# Patient Record
Sex: Female | Born: 1950
Health system: Southern US, Community
[De-identification: ages and names within clinical notes are randomized; demographics above are authoritative.]

## PROBLEM LIST (undated history)

## (undated) DIAGNOSIS — I10 Essential (primary) hypertension: Secondary | ICD-10-CM

## (undated) DIAGNOSIS — Z889 Allergy status to unspecified drugs, medicaments and biological substances status: Secondary | ICD-10-CM

---

## 1999-11-15 ENCOUNTER — Encounter: Admission: RE | Admit: 1999-11-15 | Discharge: 1999-11-15 | Payer: Self-pay | Admitting: Family Medicine

## 1999-11-15 ENCOUNTER — Encounter: Payer: Self-pay | Admitting: Family Medicine

## 2001-01-30 ENCOUNTER — Encounter: Payer: Self-pay | Admitting: Family Medicine

## 2001-01-30 ENCOUNTER — Encounter: Admission: RE | Admit: 2001-01-30 | Discharge: 2001-01-30 | Payer: Self-pay | Admitting: Dermatology

## 2001-10-16 ENCOUNTER — Other Ambulatory Visit: Admission: RE | Admit: 2001-10-16 | Discharge: 2001-10-16 | Payer: Self-pay | Admitting: Family Medicine

## 2002-02-27 ENCOUNTER — Encounter: Admission: RE | Admit: 2002-02-27 | Discharge: 2002-02-27 | Payer: Self-pay | Admitting: Obstetrics and Gynecology

## 2002-02-27 ENCOUNTER — Encounter: Payer: Self-pay | Admitting: Obstetrics and Gynecology

## 2003-06-20 ENCOUNTER — Emergency Department (HOSPITAL_COMMUNITY): Admission: EM | Admit: 2003-06-20 | Discharge: 2003-06-20 | Payer: Self-pay | Admitting: Emergency Medicine

## 2003-06-29 ENCOUNTER — Encounter: Payer: Self-pay | Admitting: Emergency Medicine

## 2003-06-29 ENCOUNTER — Emergency Department (HOSPITAL_COMMUNITY): Admission: EM | Admit: 2003-06-29 | Discharge: 2003-06-29 | Payer: Self-pay | Admitting: Emergency Medicine

## 2003-10-07 ENCOUNTER — Other Ambulatory Visit: Admission: RE | Admit: 2003-10-07 | Discharge: 2003-10-07 | Payer: Self-pay | Admitting: Obstetrics and Gynecology

## 2003-10-28 ENCOUNTER — Encounter: Admission: RE | Admit: 2003-10-28 | Discharge: 2003-10-28 | Payer: Self-pay | Admitting: Obstetrics and Gynecology

## 2003-11-20 ENCOUNTER — Encounter: Admission: RE | Admit: 2003-11-20 | Discharge: 2003-11-20 | Payer: Self-pay | Admitting: Obstetrics and Gynecology

## 2003-12-31 ENCOUNTER — Emergency Department (HOSPITAL_COMMUNITY): Admission: EM | Admit: 2003-12-31 | Discharge: 2003-12-31 | Payer: Self-pay | Admitting: Emergency Medicine

## 2004-10-06 ENCOUNTER — Other Ambulatory Visit: Admission: RE | Admit: 2004-10-06 | Discharge: 2004-10-06 | Payer: Self-pay | Admitting: Obstetrics and Gynecology

## 2004-11-08 ENCOUNTER — Ambulatory Visit (HOSPITAL_COMMUNITY): Admission: RE | Admit: 2004-11-08 | Discharge: 2004-11-08 | Payer: Self-pay | Admitting: Obstetrics and Gynecology

## 2005-07-04 ENCOUNTER — Ambulatory Visit (HOSPITAL_COMMUNITY): Admission: RE | Admit: 2005-07-04 | Discharge: 2005-07-04 | Payer: Self-pay | Admitting: Gastroenterology

## 2005-10-09 ENCOUNTER — Other Ambulatory Visit: Admission: RE | Admit: 2005-10-09 | Discharge: 2005-10-09 | Payer: Self-pay | Admitting: Obstetrics and Gynecology

## 2005-11-14 ENCOUNTER — Other Ambulatory Visit: Admission: RE | Admit: 2005-11-14 | Discharge: 2005-11-14 | Payer: Self-pay | Admitting: Obstetrics and Gynecology

## 2006-03-23 ENCOUNTER — Other Ambulatory Visit: Admission: RE | Admit: 2006-03-23 | Discharge: 2006-03-23 | Payer: Self-pay | Admitting: Obstetrics and Gynecology

## 2006-05-09 ENCOUNTER — Ambulatory Visit (HOSPITAL_COMMUNITY): Admission: RE | Admit: 2006-05-09 | Discharge: 2006-05-09 | Payer: Self-pay | Admitting: Obstetrics and Gynecology

## 2006-07-26 ENCOUNTER — Other Ambulatory Visit: Admission: RE | Admit: 2006-07-26 | Discharge: 2006-07-26 | Payer: Self-pay | Admitting: Obstetrics and Gynecology

## 2006-10-11 ENCOUNTER — Ambulatory Visit (HOSPITAL_COMMUNITY): Admission: RE | Admit: 2006-10-11 | Discharge: 2006-10-11 | Payer: Self-pay | Admitting: Obstetrics and Gynecology

## 2009-09-29 ENCOUNTER — Ambulatory Visit (HOSPITAL_COMMUNITY): Admission: RE | Admit: 2009-09-29 | Discharge: 2009-09-29 | Payer: Self-pay | Admitting: Internal Medicine

## 2011-01-17 ENCOUNTER — Other Ambulatory Visit (HOSPITAL_COMMUNITY): Payer: Self-pay | Admitting: Obstetrics and Gynecology

## 2011-01-17 DIAGNOSIS — Z1231 Encounter for screening mammogram for malignant neoplasm of breast: Secondary | ICD-10-CM

## 2011-01-20 ENCOUNTER — Ambulatory Visit (HOSPITAL_COMMUNITY)
Admission: RE | Admit: 2011-01-20 | Discharge: 2011-01-20 | Disposition: A | Discharge status: Home / Self Care | Payer: Medicare PPO | Source: Ambulatory Visit | Attending: Obstetrics and Gynecology | Admitting: Obstetrics and Gynecology

## 2011-01-20 DIAGNOSIS — Z1231 Encounter for screening mammogram for malignant neoplasm of breast: Secondary | ICD-10-CM

## 2011-12-13 ENCOUNTER — Emergency Department (HOSPITAL_BASED_OUTPATIENT_CLINIC_OR_DEPARTMENT_OTHER)
Admission: EM | Admit: 2011-12-13 | Discharge: 2011-12-13 | Disposition: A | Discharge status: Home / Self Care | Payer: No Typology Code available for payment source | Attending: Emergency Medicine | Admitting: Emergency Medicine

## 2011-12-13 ENCOUNTER — Emergency Department (INDEPENDENT_AMBULATORY_CARE_PROVIDER_SITE_OTHER): Payer: No Typology Code available for payment source

## 2011-12-13 ENCOUNTER — Encounter (HOSPITAL_BASED_OUTPATIENT_CLINIC_OR_DEPARTMENT_OTHER): Payer: Self-pay | Admitting: Emergency Medicine

## 2011-12-13 DIAGNOSIS — S161XXA Strain of muscle, fascia and tendon at neck level, initial encounter: Secondary | ICD-10-CM

## 2011-12-13 DIAGNOSIS — M542 Cervicalgia: Secondary | ICD-10-CM

## 2011-12-13 DIAGNOSIS — S139XXA Sprain of joints and ligaments of unspecified parts of neck, initial encounter: Secondary | ICD-10-CM | POA: Insufficient documentation

## 2011-12-13 DIAGNOSIS — M25519 Pain in unspecified shoulder: Secondary | ICD-10-CM

## 2011-12-13 DIAGNOSIS — R51 Headache: Secondary | ICD-10-CM

## 2011-12-13 DIAGNOSIS — Y92410 Unspecified street and highway as the place of occurrence of the external cause: Secondary | ICD-10-CM | POA: Insufficient documentation

## 2011-12-13 MED ORDER — OXYCODONE-ACETAMINOPHEN 5-325 MG PO TABS: 2.0000 | ORAL_TABLET | ORAL | Status: AC | PRN

## 2011-12-13 MED ORDER — CYCLOBENZAPRINE HCL 10 MG PO TABS: 10.0000 mg | ORAL_TABLET | Freq: Two times a day (BID) | ORAL | Status: AC | PRN

## 2011-12-13 MED ORDER — OXYCODONE-ACETAMINOPHEN 5-325 MG PO TABS: 1.0000 | ORAL_TABLET | ORAL | Status: AC | PRN

## 2011-12-13 MED ORDER — OXYCODONE-ACETAMINOPHEN 5-325 MG PO TABS
1.0000 | ORAL_TABLET | Freq: Once | ORAL | Status: AC
  Administered 2011-12-13: 1 via ORAL
  Filled 2011-12-13: qty 1
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
## 2011-12-13 NOTE — ED Notes (Signed)
MD at bedside. 
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
## 2012-01-04 ENCOUNTER — Ambulatory Visit: Payer: Medicare Other | Attending: Family Medicine | Admitting: Physical Therapy

## 2012-01-04 DIAGNOSIS — M545 Low back pain, unspecified: Secondary | ICD-10-CM | POA: Insufficient documentation

## 2012-01-04 DIAGNOSIS — IMO0001 Reserved for inherently not codable concepts without codable children: Secondary | ICD-10-CM | POA: Insufficient documentation

## 2012-01-04 DIAGNOSIS — M546 Pain in thoracic spine: Secondary | ICD-10-CM | POA: Insufficient documentation

## 2012-01-09 ENCOUNTER — Ambulatory Visit: Payer: Medicare Other | Admitting: Physical Therapy

## 2012-01-11 ENCOUNTER — Ambulatory Visit: Payer: Medicare Other | Admitting: Physical Therapy

## 2012-01-12 ENCOUNTER — Ambulatory Visit: Payer: Medicare Other | Admitting: Physical Therapy

## 2012-01-15 ENCOUNTER — Ambulatory Visit: Payer: Medicare Other | Admitting: Physical Therapy

## 2012-01-16 ENCOUNTER — Ambulatory Visit: Payer: Medicare Other | Admitting: Physical Therapy

## 2012-01-18 ENCOUNTER — Other Ambulatory Visit: Payer: Self-pay | Admitting: Family Medicine

## 2012-01-18 ENCOUNTER — Ambulatory Visit: Payer: Medicare Other | Admitting: Physical Therapy

## 2012-01-18 ENCOUNTER — Other Ambulatory Visit (HOSPITAL_COMMUNITY)
Admission: RE | Admit: 2012-01-18 | Discharge: 2012-01-18 | Disposition: A | Discharge status: Home / Self Care | Payer: Medicare Other | Source: Ambulatory Visit | Attending: Family Medicine | Admitting: Family Medicine

## 2012-01-18 DIAGNOSIS — Z124 Encounter for screening for malignant neoplasm of cervix: Secondary | ICD-10-CM | POA: Insufficient documentation

## 2012-01-23 ENCOUNTER — Other Ambulatory Visit (HOSPITAL_COMMUNITY): Payer: Self-pay | Admitting: Family Medicine

## 2012-01-23 ENCOUNTER — Ambulatory Visit: Payer: Medicare Other | Admitting: Physical Therapy

## 2012-01-23 DIAGNOSIS — Z1231 Encounter for screening mammogram for malignant neoplasm of breast: Secondary | ICD-10-CM

## 2012-01-25 ENCOUNTER — Ambulatory Visit: Payer: Medicare Other | Admitting: Physical Therapy

## 2012-02-09 ENCOUNTER — Ambulatory Visit (HOSPITAL_COMMUNITY)
Admission: RE | Admit: 2012-02-09 | Discharge: 2012-02-09 | Disposition: A | Discharge status: Home / Self Care | Payer: Medicare Other | Source: Ambulatory Visit | Attending: Family Medicine | Admitting: Family Medicine

## 2012-02-09 DIAGNOSIS — Z1231 Encounter for screening mammogram for malignant neoplasm of breast: Secondary | ICD-10-CM | POA: Insufficient documentation

## 2013-02-12 ENCOUNTER — Telehealth: Payer: Self-pay | Admitting: *Deleted

## 2013-02-12 NOTE — Telephone Encounter (Signed)
Opened in error

## 2013-05-12 ENCOUNTER — Other Ambulatory Visit (HOSPITAL_COMMUNITY): Payer: Self-pay | Admitting: Family Medicine

## 2013-05-12 DIAGNOSIS — Z1231 Encounter for screening mammogram for malignant neoplasm of breast: Secondary | ICD-10-CM

## 2013-05-26 ENCOUNTER — Ambulatory Visit (HOSPITAL_COMMUNITY): Payer: Medicare Other

## 2013-06-04 ENCOUNTER — Ambulatory Visit (HOSPITAL_COMMUNITY)
Admission: RE | Admit: 2013-06-04 | Discharge: 2013-06-04 | Disposition: A | Discharge status: Home / Self Care | Payer: Medicare Other | Source: Ambulatory Visit | Attending: Family Medicine | Admitting: Family Medicine

## 2013-06-04 DIAGNOSIS — Z1231 Encounter for screening mammogram for malignant neoplasm of breast: Secondary | ICD-10-CM

## 2014-07-11 ENCOUNTER — Encounter (HOSPITAL_COMMUNITY): Payer: Self-pay | Admitting: Emergency Medicine

## 2014-07-11 ENCOUNTER — Emergency Department (HOSPITAL_COMMUNITY): Payer: Medicare Other

## 2014-07-11 ENCOUNTER — Emergency Department (HOSPITAL_COMMUNITY)
Admission: EM | Admit: 2014-07-11 | Discharge: 2014-07-11 | Disposition: A | Discharge status: Home / Self Care | Payer: Medicare Other | Attending: Emergency Medicine | Admitting: Emergency Medicine

## 2014-07-11 DIAGNOSIS — N289 Disorder of kidney and ureter, unspecified: Secondary | ICD-10-CM

## 2014-07-11 DIAGNOSIS — Z880 Allergy status to penicillin: Secondary | ICD-10-CM | POA: Diagnosis not present

## 2014-07-11 DIAGNOSIS — Z79899 Other long term (current) drug therapy: Secondary | ICD-10-CM | POA: Diagnosis not present

## 2014-07-11 DIAGNOSIS — R55 Syncope and collapse: Secondary | ICD-10-CM | POA: Diagnosis present

## 2014-07-11 DIAGNOSIS — E860 Dehydration: Secondary | ICD-10-CM | POA: Insufficient documentation

## 2014-07-11 LAB — COMPREHENSIVE METABOLIC PANEL
ALBUMIN: 3.9 g/dL (ref 3.5–5.2)
ALT: 21 U/L (ref 0–35)
ANION GAP: 12 (ref 5–15)
AST: 21 U/L (ref 0–37)
Alkaline Phosphatase: 95 U/L (ref 39–117)
BUN: 18 mg/dL (ref 6–23)
CALCIUM: 9 mg/dL (ref 8.4–10.5)
CHLORIDE: 106 meq/L (ref 96–112)
CO2: 26 meq/L (ref 19–32)
Creatinine, Ser: 1.23 mg/dL — ABNORMAL HIGH (ref 0.50–1.10)
GFR calc Af Amer: 54 mL/min — ABNORMAL LOW (ref >90)
GFR calc non Af Amer: 46 mL/min — ABNORMAL LOW (ref >90)
GLUCOSE: 118 mg/dL — AB (ref 70–99)
POTASSIUM: 4.6 meq/L (ref 3.7–5.3)
Sodium: 144 mEq/L (ref 137–147)
TOTAL PROTEIN: 7.5 g/dL (ref 6.0–8.3)
Total Bilirubin: 0.2 mg/dL — ABNORMAL LOW (ref 0.3–1.2)

## 2014-07-11 LAB — TROPONIN I

## 2014-07-11 LAB — LACTIC ACID, PLASMA: Lactic Acid, Venous: 1.2 mmol/L (ref 0.5–2.2)

## 2014-07-11 LAB — CBC WITH DIFFERENTIAL/PLATELET
Basophils Absolute: 0 10*3/uL (ref 0.0–0.1)
Basophils Relative: 0 % (ref 0–1)
Eosinophils Absolute: 0 10*3/uL (ref 0.0–0.7)
Eosinophils Relative: 0 % (ref 0–5)
HCT: 42.3 % (ref 36.0–46.0)
HEMOGLOBIN: 14.1 g/dL (ref 12.0–15.0)
Lymphocytes Relative: 13 % (ref 12–46)
Lymphs Abs: 1.4 10*3/uL (ref 0.7–4.0)
MCH: 29.9 pg (ref 26.0–34.0)
MCHC: 33.3 g/dL (ref 30.0–36.0)
MCV: 89.6 fL (ref 78.0–100.0)
MONO ABS: 0.6 10*3/uL (ref 0.1–1.0)
Monocytes Relative: 6 % (ref 3–12)
Neutro Abs: 8.4 10*3/uL — ABNORMAL HIGH (ref 1.7–7.7)
Neutrophils Relative %: 81 % — ABNORMAL HIGH (ref 43–77)
Platelets: 208 10*3/uL (ref 150–400)
RBC: 4.72 MIL/uL (ref 3.87–5.11)
RDW: 13.2 % (ref 11.5–15.5)
WBC: 10.4 10*3/uL (ref 4.0–10.5)

## 2014-07-11 NOTE — Discharge Instructions (Signed)

## 2014-07-11 NOTE — ED Provider Notes (Signed)
CSN: [id number]     Arrival date & time 07/11/14  1702 History   First MD Initiated Contact with Patient 07/11/14 1713     Chief Complaint  Patient presents with  . Near Syncope      Patient is a 63 y.o. female presenting with near-syncope.  Near Syncope  Pt was in downtown [place] at [employer] and had not been taking in much POs throughout the day. She had a near syncopal episode and her intial systolic BP was in the 70J. Pt was symptomatic with dizziness and so received about 750 of NS bolus IV in the field. No chest pain or ShOB.  Patient denies taking medications.  She did not have total syncope.  She was outside when this occurred but she denies having any vomiting or diarrhea prior to this.    History reviewed. No pertinent past medical history. History reviewed. No pertinent past surgical history. History reviewed. No pertinent family history. History  Substance Use Topics  . Smoking status: Never Smoker   . Smokeless tobacco: Not on file  . Alcohol Use: No   OB History   Grav Para Term Preterm Abortions TAB SAB Ect Mult Living                 Review of Systems  Cardiovascular: Positive for near-syncope.  All other systems reviewed and are negative.     Allergies  Ampicillin; Macrodantin; Other; and Penicillins  Home Medications   Prior to Admission medications   Medication Sig Start Date End Date Taking? Authorizing Provider  diphenhydrAMINE (BENADRYL) 25 MG tablet Take 25 mg by mouth every 6 (six) hours as needed for allergies.   Yes Historical Provider, MD  montelukast (SINGULAIR) 10 MG tablet Take 10 mg by mouth at bedtime.   Yes Historical Provider, MD   There were no vitals taken for this visit. Physical Exam  Nursing note and vitals reviewed. Constitutional: She is oriented to person, place, and time. She appears well-developed and well-nourished. No distress.  HENT:  Head: Normocephalic and atraumatic.  Eyes: Pupils are equal, round, and reactive  to light.  Neck: Normal range of motion.  Cardiovascular: Normal rate and intact distal pulses.   Pulmonary/Chest: No respiratory distress.  Abdominal: Normal appearance. She exhibits no distension.  Musculoskeletal: Normal range of motion.  Neurological: She is alert and oriented to person, place, and time. No cranial nerve deficit.  Skin: Skin is warm and dry. No rash noted.  Psychiatric: She has a normal mood and affect. Her behavior is normal.    ED Course  Procedures (including critical care time) Labs Review Labs Reviewed  CBC WITH DIFFERENTIAL - Abnormal; Notable for the following:    Neutrophils Relative % 81 (*)    Neutro Abs 8.4 (*)    All other components within normal limits  COMPREHENSIVE METABOLIC PANEL - Abnormal; Notable for the following:    Glucose, Bld 118 (*)    Creatinine, Ser 1.23 (*)    Total Bilirubin 0.2 (*)    GFR calc non Af Amer 46 (*)    GFR calc Af Amer 54 (*)    All other components within normal limits  LACTIC ACID, PLASMA  TROPONIN I    Imaging Review Dg Chest Portable 1 View  07/11/2014   CLINICAL DATA:  Near syncope.  EXAM: PORTABLE CHEST - 1 VIEW  COMPARISON:  None.  FINDINGS: The heart size and mediastinal contours are within normal limits. Both lungs are clear. The visualized  skeletal structures are unremarkable.  IMPRESSION: No active disease.   Electronically Signed   By: Kerby Moors M.D.   On: 07/11/2014 18:06     EKG Interpretation   Date/Time:  Saturday July 11 2014 18:09:31 EDT Ventricular Rate:  81 PR Interval:  231 QRS Duration: 68 QT Interval:  380 QTC Calculation: 441 R Axis:   37 Text Interpretation:  Sinus rhythm Prolonged PR interval Probable left  atrial enlargement Low voltage, precordial leads Confirmed by Paizlee Kinder  MD,  Labrian Torregrossa ([id number]) on 07/11/2014 6:35:07 PM     After treatment in the ED the patient feels back to baseline and wants to go home. MDM   Final diagnoses:  Dehydration  Renal insufficiency         Dot Lanes, MD 07/11/14 2002

## 2014-07-11 NOTE — ED Notes (Signed)
Bed: XK55 Expected date: 07/11/14 Expected time:  Means of arrival:  Comments: Syncopal

## 2014-07-11 NOTE — ED Notes (Signed)
Pt was in [place] at [employer] and had not been taking in much POs throughout the day. She had a near syncopal episode and her intial systolic BP was in the 42A. Pt was symptomatic with dizziness and so received about 750 of NS bolus IV in the field. No chest pain or ShOB.

## 2014-07-24 ENCOUNTER — Other Ambulatory Visit (HOSPITAL_COMMUNITY): Payer: Self-pay | Admitting: Family Medicine

## 2014-07-24 DIAGNOSIS — Z1231 Encounter for screening mammogram for malignant neoplasm of breast: Secondary | ICD-10-CM

## 2014-07-30 ENCOUNTER — Ambulatory Visit (HOSPITAL_COMMUNITY): Payer: Medicare PPO

## 2014-07-31 ENCOUNTER — Ambulatory Visit (HOSPITAL_COMMUNITY)
Admission: RE | Admit: 2014-07-31 | Discharge: 2014-07-31 | Disposition: A | Discharge status: Home / Self Care | Payer: Medicare Other | Source: Ambulatory Visit | Attending: Family Medicine | Admitting: Family Medicine

## 2014-07-31 DIAGNOSIS — Z1231 Encounter for screening mammogram for malignant neoplasm of breast: Secondary | ICD-10-CM | POA: Insufficient documentation

## 2014-08-03 ENCOUNTER — Other Ambulatory Visit (HOSPITAL_COMMUNITY): Payer: Self-pay | Admitting: Obstetrics & Gynecology

## 2015-09-13 ENCOUNTER — Other Ambulatory Visit: Payer: Self-pay

## 2015-09-13 DIAGNOSIS — Z1231 Encounter for screening mammogram for malignant neoplasm of breast: Secondary | ICD-10-CM

## 2015-09-15 ENCOUNTER — Ambulatory Visit
Admission: RE | Admit: 2015-09-15 | Discharge: 2015-09-15 | Disposition: A | Discharge status: Home / Self Care | Payer: PPO | Source: Ambulatory Visit

## 2015-09-15 DIAGNOSIS — Z1231 Encounter for screening mammogram for malignant neoplasm of breast: Secondary | ICD-10-CM

## 2016-04-20 DIAGNOSIS — M25461 Effusion, right knee: Secondary | ICD-10-CM | POA: Diagnosis not present

## 2016-04-27 DIAGNOSIS — M25461 Effusion, right knee: Secondary | ICD-10-CM | POA: Diagnosis not present

## 2016-07-30 ENCOUNTER — Emergency Department (HOSPITAL_BASED_OUTPATIENT_CLINIC_OR_DEPARTMENT_OTHER): Payer: PPO

## 2016-07-30 ENCOUNTER — Emergency Department (HOSPITAL_BASED_OUTPATIENT_CLINIC_OR_DEPARTMENT_OTHER)
Admission: EM | Admit: 2016-07-30 | Discharge: 2016-07-31 | Disposition: A | Discharge status: Home / Self Care | Payer: PPO | Attending: Emergency Medicine | Admitting: Emergency Medicine

## 2016-07-30 ENCOUNTER — Encounter (HOSPITAL_BASED_OUTPATIENT_CLINIC_OR_DEPARTMENT_OTHER): Payer: Self-pay | Admitting: Emergency Medicine

## 2016-07-30 DIAGNOSIS — S63501A Unspecified sprain of right wrist, initial encounter: Secondary | ICD-10-CM | POA: Diagnosis not present

## 2016-07-30 DIAGNOSIS — M25569 Pain in unspecified knee: Secondary | ICD-10-CM

## 2016-07-30 DIAGNOSIS — Y929 Unspecified place or not applicable: Secondary | ICD-10-CM | POA: Insufficient documentation

## 2016-07-30 DIAGNOSIS — Z79899 Other long term (current) drug therapy: Secondary | ICD-10-CM | POA: Diagnosis not present

## 2016-07-30 DIAGNOSIS — Y939 Activity, unspecified: Secondary | ICD-10-CM | POA: Insufficient documentation

## 2016-07-30 DIAGNOSIS — Y999 Unspecified external cause status: Secondary | ICD-10-CM | POA: Insufficient documentation

## 2016-07-30 DIAGNOSIS — M25461 Effusion, right knee: Secondary | ICD-10-CM | POA: Diagnosis not present

## 2016-07-30 DIAGNOSIS — S6991XA Unspecified injury of right wrist, hand and finger(s), initial encounter: Secondary | ICD-10-CM | POA: Diagnosis not present

## 2016-07-30 DIAGNOSIS — S8001XA Contusion of right knee, initial encounter: Secondary | ICD-10-CM | POA: Diagnosis not present

## 2016-07-30 DIAGNOSIS — W010XXA Fall on same level from slipping, tripping and stumbling without subsequent striking against object, initial encounter: Secondary | ICD-10-CM | POA: Diagnosis not present

## 2016-07-30 DIAGNOSIS — M79641 Pain in right hand: Secondary | ICD-10-CM | POA: Diagnosis not present

## 2016-07-30 DIAGNOSIS — M7989 Other specified soft tissue disorders: Secondary | ICD-10-CM | POA: Diagnosis not present

## 2016-07-30 HISTORY — DX: Allergy status to unspecified drugs, medicaments and biological substances: Z88.9

## 2016-07-30 NOTE — ED Notes (Signed)
Pt given d/c instructions as per chart. Verbalizes understanding. No questions. 

## 2016-07-30 NOTE — Discharge Instructions (Signed)
Please read and follow all provided instructions.  Your diagnoses today include:  1. Sprain of right wrist, initial encounter   2. Knee pain   3. Contusion of right knee, initial encounter     Tests performed today include:  An x-ray of the affected area - does NOT show any broken bones  Vital signs. See below for your results today.   Medications prescribed:   None  Take any prescribed medications only as directed.  Home care instructions:   Follow any educational materials contained in this packet  Follow R.I.C.E. Protocol:  R - rest your injury   I  - use ice on injury without applying directly to skin  C - compress injury with bandage or splint  E - elevate the injury as much as possible  Follow-up instructions: Please follow-up with your primary care provider or the provided orthopedic physician (bone specialist) if you continue to have significant pain in 1 week. In this case you may have a more severe injury that requires further care.   Return instructions:   Please return if your fingers are numb or tingling, appear gray or blue, or you have severe pain (also elevate the arm and loosen splint or wrap if you were given one)  Please return to the Emergency Department if you experience worsening symptoms.   Please return if you have any other emergent concerns.  Additional Information:  Your vital signs today were: BP 175/94 (BP Location: Left Arm)    Pulse 91    Temp 98.4 F (36.9 C) (Oral)    Resp 20    Ht 5\' 4"  (1.626 m)    Wt 86.2 kg    SpO2 98%    BMI 32.61 kg/m  If your blood pressure (BP) was elevated above 135/85 this visit, please have this repeated by your doctor within one month. --------------

## 2016-07-30 NOTE — ED Provider Notes (Signed)
[facility] Provider Note   CSN: [id number] Arrival date & time: 07/30/16  1732  By signing my name below, I, Dora Sims, attest that this documentation has been prepared under the direction and in the presence of [employer], PA-C. Electronically Signed: Dora Sims, Scribe. 07/30/2016. 7:07 PM.  History   Chief Complaint Chief Complaint  Patient presents with  . Fall    The history is provided by the patient. No language interpreter was used.    HPI Comments: Christina Sherman is a 65 y.o. female who presents to the Emergency Department complaining of exacerbation of her right knee pain s/p falling about 4 hours ago. Pt reports she slipped on string beans on the floor and landed on her right side. She states she reached out with her right hand to brace her fall and notes some pain and swelling to her right hand since the fall. She denies hitting her head or injuring her neck during the fall. Pt reports she has been wearing a right knee brace due to flare ups of fluid in her right knee. Pt notes she had fluid drained from her right knee a couple of months ago at [facility] that has since come back, and she reports associated right knee pain. Pt has not tried any medications or treatments PTA. She does not regularly use anti-inflammatories or pain medications for her right knee pain and swelling. Pt does not use blood thinners. She notes no allergies to or problems with NSAIDs. Pt denies right elbow pain, right shoulder pain, syncope, or any other associated symptoms.  Past Medical History:  Diagnosis Date  . H/O seasonal allergies     There are no active problems to display for this patient.   History reviewed. No pertinent surgical history.  OB History    No data available       Home Medications    Prior to Admission medications   Medication Sig Start Date End Date Taking? Authorizing Provider  diphenhydrAMINE (BENADRYL) 25 MG tablet Take 25 mg by  mouth every 6 (six) hours as needed for allergies.    Historical Provider, MD  montelukast (SINGULAIR) 10 MG tablet Take 10 mg by mouth at bedtime.    Historical Provider, MD    Family History History reviewed. No pertinent family history.  Social History Social History  Substance Use Topics  . Smoking status: Never Smoker  . Smokeless tobacco: Never Used  . Alcohol use No     Allergies   Ampicillin; Macrodantin; Other; and Penicillins   Review of Systems Review of Systems  Constitutional: Negative for activity change.  Musculoskeletal: Positive for arthralgias (right knee, right hand) and joint swelling (right knee, right hand). Negative for back pain and neck pain.  Skin: Negative for wound.  Neurological: Negative for syncope, weakness and numbness.    Physical Exam Updated Vital Signs BP 175/94 (BP Location: Left Arm)   Pulse 91   Temp 98.4 F (36.9 C) (Oral)   Resp 20   Ht 5\' 4"  (1.626 m)   Wt 190 lb (86.2 kg)   SpO2 98%   BMI 32.61 kg/m   Physical Exam  Constitutional: She is oriented to person, place, and time. She appears well-developed and well-nourished. No distress.  HENT:  Head: Normocephalic and atraumatic.  Eyes: Conjunctivae and EOM are normal. Pupils are equal, round, and reactive to light.  Neck: Normal range of motion. Neck supple. No tracheal deviation present.  Cardiovascular: Normal rate and normal pulses.  Exam reveals no decreased pulses.   Pulmonary/Chest: Effort normal. No respiratory distress.  Musculoskeletal: She exhibits tenderness. She exhibits no edema.       Right elbow: Normal.      Right wrist: She exhibits tenderness. She exhibits normal range of motion, no bony tenderness and no swelling.       Right knee: She exhibits decreased range of motion and effusion. Tenderness found. Medial joint line and lateral joint line tenderness noted.       Right ankle: Normal.       Right forearm: Normal.       Right hand: Normal.       Right  upper leg: Normal.       Right lower leg: Normal.  Neurological: She is alert and oriented to person, place, and time. No sensory deficit.  Motor, sensation, and vascular distal to the injury is fully intact.   Skin: Skin is warm and dry.  Psychiatric: She has a normal mood and affect. Her behavior is normal.  Nursing note and vitals reviewed.   ED Treatments / Results   Radiology Dg Knee Complete 4 Views Right  Result Date: 07/30/2016 CLINICAL DATA:  Anteromedial knee pain and swelling after falling at church today. EXAM: RIGHT KNEE - COMPLETE 4+ VIEW COMPARISON:  Radiograph 04/20/2016. FINDINGS: The mineralization and alignment are normal. There is no evidence of acute fracture or dislocation. There are stable mild tricompartmental degenerative changes. There may be mild lateral meniscal chondrocalcinosis versus artifact. There is a stable small knee joint effusion. IMPRESSION: No acute osseous findings. Stable small joint effusion and mild degenerative changes. Electronically Signed   By: Richardean Sale M.D.   On: 07/30/2016 18:09   Dg Hand Complete Right  Result Date: 07/30/2016 CLINICAL DATA:  Fall today at church. Right hand pain and swelling in the region of the first metacarpal. EXAM: RIGHT HAND - COMPLETE 3+ VIEW COMPARISON:  None. FINDINGS: The mineralization and alignment are normal. There is no evidence of acute fracture or dislocation. There are scattered mild degenerative changes, primarily involving the interphalangeal and metacarpal phalangeal joints. There are mild degenerative changes at the first [facility] joint. No focal soft tissue abnormalities are seen. IMPRESSION: No acute osseous findings.  Mild degenerative changes. Electronically Signed   By: Richardean Sale M.D.   On: 07/30/2016 18:10    Procedures Procedures (including critical care time)  DIAGNOSTIC STUDIES: Oxygen Saturation is 98% on RA, normal by my interpretation.    COORDINATION OF CARE: 7:12 PM Will order DG  Right Knee Complete 4 Views. Will order DG Right Hand Complete. Discussed treatment plan with pt at bedside and pt agreed to plan.  Medications Ordered in ED Medications - No data to display   Initial Impression / Assessment and Plan / ED Course  I have reviewed the triage vital signs and the nursing notes.  Pertinent labs & imaging results that were available during my care of the patient were reviewed by me and considered in my medical decision making (see chart for details).  Clinical Course   Patient seen and examined.   Vital signs reviewed and are as follows: BP 175/94 (BP Location: Left Arm)   Pulse 91   Temp 98.4 F (36.9 C) (Oral)   Resp 20   Ht 5\' 4"  (1.626 m)   Wt 86.2 kg   SpO2 98%   BMI 32.61 kg/m    Will provide with smoker wrist splint. She will continue to use right knee brace.  Orthopedic follow-up given. She may use NSAIDs as needed at home for pain and swelling. Discussed rice protocol.   I personally performed the services described in this documentation, which was scribed in my presence. The recorded information has been reviewed and is accurate.   Final Clinical Impressions(s) / ED Diagnoses   Final diagnoses:  Knee pain  Sprain of right wrist, initial encounter  Contusion of right knee, initial encounter   Knee pain/effusion: Acute on chronic. X-ray shows arthritis, no fracture. Patient is ambulatory. Conservative measures indicated.  Wrist sprain: X-rays negative, hand and fingers are neurovascularly intact. Splint given. Conservative measures indicated.   New Prescriptions New Prescriptions   No medications on file     Carlisle Cater, PA-C 07/30/16 [place], MD 07/30/16 [id number]

## 2016-07-30 NOTE — ED Triage Notes (Addendum)
Patient reports she slipped and fell today at church. Was wearing a brace on the right knee due to fluid on the knee. Patient c/o swelling to right hand after the fall and an increase in right knee pain. Patient has applied ice to the knee after the fall, no medications taken PTA for pain. Patient is ambulatory.

## 2016-08-28 DIAGNOSIS — M25561 Pain in right knee: Secondary | ICD-10-CM | POA: Diagnosis not present

## 2016-08-28 DIAGNOSIS — M25562 Pain in left knee: Secondary | ICD-10-CM | POA: Diagnosis not present

## 2016-09-15 DIAGNOSIS — Z1231 Encounter for screening mammogram for malignant neoplasm of breast: Secondary | ICD-10-CM | POA: Diagnosis not present

## 2016-10-20 DIAGNOSIS — H35371 Puckering of macula, right eye: Secondary | ICD-10-CM | POA: Diagnosis not present

## 2016-10-20 DIAGNOSIS — H25013 Cortical age-related cataract, bilateral: Secondary | ICD-10-CM | POA: Diagnosis not present

## 2016-10-20 DIAGNOSIS — H2513 Age-related nuclear cataract, bilateral: Secondary | ICD-10-CM | POA: Diagnosis not present

## 2016-10-20 DIAGNOSIS — H40013 Open angle with borderline findings, low risk, bilateral: Secondary | ICD-10-CM | POA: Diagnosis not present

## 2016-12-28 DIAGNOSIS — J069 Acute upper respiratory infection, unspecified: Secondary | ICD-10-CM | POA: Diagnosis not present

## 2017-10-04 DIAGNOSIS — Z1231 Encounter for screening mammogram for malignant neoplasm of breast: Secondary | ICD-10-CM | POA: Diagnosis not present

## 2017-10-04 DIAGNOSIS — Z124 Encounter for screening for malignant neoplasm of cervix: Secondary | ICD-10-CM | POA: Diagnosis not present

## 2017-10-04 DIAGNOSIS — B009 Herpesviral infection, unspecified: Secondary | ICD-10-CM | POA: Diagnosis not present

## 2017-10-04 DIAGNOSIS — R102 Pelvic and perineal pain: Secondary | ICD-10-CM | POA: Diagnosis not present

## 2017-10-04 DIAGNOSIS — Z6834 Body mass index (BMI) 34.0-34.9, adult: Secondary | ICD-10-CM | POA: Diagnosis not present

## 2017-10-26 DIAGNOSIS — H25013 Cortical age-related cataract, bilateral: Secondary | ICD-10-CM | POA: Diagnosis not present

## 2017-10-26 DIAGNOSIS — H40013 Open angle with borderline findings, low risk, bilateral: Secondary | ICD-10-CM | POA: Diagnosis not present

## 2017-10-26 DIAGNOSIS — H2513 Age-related nuclear cataract, bilateral: Secondary | ICD-10-CM | POA: Diagnosis not present

## 2017-10-26 DIAGNOSIS — H40053 Ocular hypertension, bilateral: Secondary | ICD-10-CM | POA: Diagnosis not present

## 2017-12-04 DIAGNOSIS — R102 Pelvic and perineal pain: Secondary | ICD-10-CM | POA: Diagnosis not present

## 2018-02-05 DIAGNOSIS — H40013 Open angle with borderline findings, low risk, bilateral: Secondary | ICD-10-CM | POA: Diagnosis not present

## 2018-02-05 DIAGNOSIS — H40053 Ocular hypertension, bilateral: Secondary | ICD-10-CM | POA: Diagnosis not present

## 2018-02-20 ENCOUNTER — Other Ambulatory Visit: Payer: Self-pay

## 2018-02-20 ENCOUNTER — Emergency Department (HOSPITAL_BASED_OUTPATIENT_CLINIC_OR_DEPARTMENT_OTHER)
Admission: EM | Admit: 2018-02-20 | Discharge: 2018-02-20 | Disposition: A | Discharge status: Home / Self Care | Payer: PPO | Attending: Physician Assistant | Admitting: Physician Assistant

## 2018-02-20 ENCOUNTER — Emergency Department (HOSPITAL_BASED_OUTPATIENT_CLINIC_OR_DEPARTMENT_OTHER): Payer: PPO

## 2018-02-20 ENCOUNTER — Encounter (HOSPITAL_BASED_OUTPATIENT_CLINIC_OR_DEPARTMENT_OTHER): Payer: Self-pay | Admitting: *Deleted

## 2018-02-20 DIAGNOSIS — N23 Unspecified renal colic: Secondary | ICD-10-CM | POA: Diagnosis not present

## 2018-02-20 DIAGNOSIS — Z79899 Other long term (current) drug therapy: Secondary | ICD-10-CM | POA: Insufficient documentation

## 2018-02-20 DIAGNOSIS — N390 Urinary tract infection, site not specified: Secondary | ICD-10-CM | POA: Diagnosis not present

## 2018-02-20 DIAGNOSIS — R3129 Other microscopic hematuria: Secondary | ICD-10-CM | POA: Diagnosis not present

## 2018-02-20 DIAGNOSIS — R109 Unspecified abdominal pain: Secondary | ICD-10-CM

## 2018-02-20 DIAGNOSIS — R1031 Right lower quadrant pain: Secondary | ICD-10-CM | POA: Insufficient documentation

## 2018-02-20 LAB — CBC WITH DIFFERENTIAL/PLATELET
BASOS ABS: 0 10*3/uL (ref 0.0–0.1)
BASOS PCT: 0 %
EOS ABS: 0.1 10*3/uL (ref 0.0–0.7)
EOS PCT: 2 %
HCT: 44.5 % (ref 36.0–46.0)
Hemoglobin: 15.3 g/dL — ABNORMAL HIGH (ref 12.0–15.0)
Lymphocytes Relative: 35 %
Lymphs Abs: 2.6 10*3/uL (ref 0.7–4.0)
MCH: 29.1 pg (ref 26.0–34.0)
MCHC: 34.4 g/dL (ref 30.0–36.0)
MCV: 84.8 fL (ref 78.0–100.0)
Monocytes Absolute: 0.4 10*3/uL (ref 0.1–1.0)
Monocytes Relative: 5 %
Neutro Abs: 4.3 10*3/uL (ref 1.7–7.7)
Neutrophils Relative %: 58 %
PLATELETS: 235 10*3/uL (ref 150–400)
RBC: 5.25 MIL/uL — AB (ref 3.87–5.11)
RDW: 13.7 % (ref 11.5–15.5)
WBC: 7.4 10*3/uL (ref 4.0–10.5)

## 2018-02-20 LAB — COMPREHENSIVE METABOLIC PANEL
ALBUMIN: 4.3 g/dL (ref 3.5–5.0)
ALT: 24 U/L (ref 14–54)
AST: 23 U/L (ref 15–41)
Alkaline Phosphatase: 81 U/L (ref 38–126)
Anion gap: 10 (ref 5–15)
BUN: 10 mg/dL (ref 6–20)
CHLORIDE: 108 mmol/L (ref 101–111)
CO2: 20 mmol/L — AB (ref 22–32)
CREATININE: 1.02 mg/dL — AB (ref 0.44–1.00)
Calcium: 8.8 mg/dL — ABNORMAL LOW (ref 8.9–10.3)
GFR calc non Af Amer: 57 mL/min — ABNORMAL LOW (ref >60)
Glucose, Bld: 92 mg/dL (ref 65–99)
Potassium: 3.5 mmol/L (ref 3.5–5.1)
SODIUM: 138 mmol/L (ref 135–145)
Total Bilirubin: 0.5 mg/dL (ref 0.3–1.2)
Total Protein: 7.4 g/dL (ref 6.5–8.1)

## 2018-02-20 LAB — URINALYSIS, ROUTINE W REFLEX MICROSCOPIC
Bilirubin Urine: NEGATIVE
Glucose, UA: NEGATIVE mg/dL
Ketones, ur: NEGATIVE mg/dL
NITRITE: NEGATIVE
PROTEIN: NEGATIVE mg/dL
Specific Gravity, Urine: <1.005 — ABNORMAL LOW (ref 1.005–1.030)
pH: 6 (ref 5.0–8.0)

## 2018-02-20 LAB — LIPASE, BLOOD: Lipase: 31 U/L (ref 11–51)

## 2018-02-20 LAB — URINALYSIS, MICROSCOPIC (REFLEX)

## 2018-02-20 MED ORDER — SODIUM CHLORIDE 0.9 % IV BOLUS: 1000.0000 mL | Freq: Once | INTRAVENOUS | Status: DC

## 2018-02-20 NOTE — Discharge Instructions (Addendum)
You are unsure what is is causing her occasional pain when you move.  We think is likely due to muscular skeletal pain.  We want you to follow-up with your primary care physician to make sure that the trace hemoglobin in your urine resolves.  If not you will need follow-up with a urologist.    If you have any symptoms such as burning or pain with urination, please return to follow-up with your primary care physician.  We have sent your urine for culture however does not appear to be infected at this time.

## 2018-02-20 NOTE — ED Provider Notes (Signed)
[facility] EMERGENCY DEPARTMENT Provider Note   CSN: [id number] Arrival date & time: 02/20/18  1322     History   Chief Complaint Chief Complaint  Patient presents with  . Flank Pain    HPI AVAYA MCJUNKINS is a 67 y.o. female.  HPI   Patient is a 67 year old female presenting with occasional twinges of pain in her right side.  Patient reports that she will went from sitting to standing yesterday and all of a sudden had a sharp pain there.  It has come and gone.  No nausea no vomiting.  No urinary symptoms.  No fever.  No aches and pains otherwise.  Apparently at the primary care physician's office the urine showed a trace blood.  She was sent here for further evaluation for CAT scan for stone.  No history of stones.  Past Medical History:  Diagnosis Date  . H/O seasonal allergies     There are no active problems to display for this patient.   History reviewed. No pertinent surgical history.   OB History   None      Home Medications    Prior to Admission medications   Medication Sig Start Date End Date Taking? Authorizing Provider  diphenhydrAMINE (BENADRYL) 25 MG tablet Take 25 mg by mouth every 6 (six) hours as needed for allergies.    [provider]  montelukast (SINGULAIR) 10 MG tablet Take 10 mg by mouth at bedtime.    [provider]    Family History History reviewed. No pertinent family history.  Social History Social History   Tobacco Use  . Smoking status: Never Smoker  . Smokeless tobacco: Never Used  Substance Use Topics  . Alcohol use: No  . Drug use: No     Allergies   Ampicillin; Macrodantin; Other; and Penicillins   Review of Systems Review of Systems  Constitutional: Negative for activity change, fatigue and fever.  HENT: Negative for congestion.   Respiratory: Negative for shortness of breath.   Cardiovascular: Negative for chest pain.  Gastrointestinal: Negative for abdominal pain.  Genitourinary:  Positive for flank pain. Negative for difficulty urinating and dysuria.  Musculoskeletal: Negative for arthralgias.  All other systems reviewed and are negative.    Physical Exam Updated Vital Signs BP (!) 161/102 (BP Location: Left Arm)   Pulse 86   Temp 98.2 F (36.8 C) (Oral)   Resp 16   Ht 5\' 4"  (1.626 m)   Wt 90.3 kg (199 lb)   SpO2 98%   BMI 34.16 kg/m   Physical Exam  Constitutional: She is oriented to person, place, and time. She appears well-developed and well-nourished.  HENT:  Head: Normocephalic and atraumatic.  Eyes: Right eye exhibits no discharge. Left eye exhibits no discharge.  Cardiovascular: Normal rate and regular rhythm.  Pulmonary/Chest: Effort normal and breath sounds normal. No respiratory distress.  Abdominal: Soft. She exhibits no distension. There is no tenderness.  Neurological: She is oriented to person, place, and time.  Skin: Skin is warm and dry. She is not diaphoretic.  Psychiatric: She has a normal mood and affect.  Nursing note and vitals reviewed.    ED Treatments / Results  Labs (all labs ordered are listed, but only abnormal results are displayed) Labs Reviewed  COMPREHENSIVE METABOLIC PANEL  CBC WITH DIFFERENTIAL/PLATELET  URINALYSIS, ROUTINE W REFLEX MICROSCOPIC  LIPASE, BLOOD    EKG None  Radiology Ct Renal Stone Study  Result Date: 02/20/2018 CLINICAL DATA:  Flank pain with  stone disease suspected. Right flank pain and microscopic hematuria EXAM: CT ABDOMEN AND PELVIS WITHOUT CONTRAST TECHNIQUE: Multidetector CT imaging of the abdomen and pelvis was performed following the standard protocol without IV contrast. COMPARISON:  None. FINDINGS: Lower chest:  No contributory findings. Hepatobiliary: No focal abnormality when accounting for scan heterogeneity.No evidence of biliary obstruction or stone. Pancreas: Unremarkable. Spleen: Unremarkable. Adrenals/Urinary Tract: Negative adrenals. No hydronephrosis or stone. Unremarkable  bladder. Stomach/Bowel:  No obstruction. No appendicitis. Vascular/Lymphatic: No acute vascular abnormality. Moderate atherosclerotic calcification of the aorta and iliacs. No mass or adenopathy. Reproductive:Probable anterior uterine fibroid measuring 18 mm. Other: No ascites or pneumoperitoneum. Musculoskeletal: No acute abnormalities. Osteitis pubis. L4-5 and L5-S1 facet arthropathy with slight L4-5 anterolisthesis. Advanced L5-S1 disc degeneration. IMPRESSION: 1. No acute finding. No hydronephrosis or urolithiasis. No appendicitis. 2.  Aortic Atherosclerosis (ICD10-I70.0). 3. Probable small uterine fibroid. Electronically Signed   By: Monte Fantasia M.D.   On: 02/20/2018 14:07    Procedures Procedures (including critical care time)  Medications Ordered in ED Medications  sodium chloride 0.9 % bolus 1,000 mL (has no administration in time range)     Initial Impression / Assessment and Plan / ED Course  I have reviewed the triage vital signs and the nursing notes.  Pertinent labs & imaging results that were available during my care of the patient were reviewed by me and considered in my medical decision making (see chart for details).     Patient is a 67 year old female presenting with occasional twinges of pain in her right side.  Patient reports that she will went from sitting to standing yesterday and all of a sudden had a sharp pain there.  It has come and gone.  No nausea no vomiting.  No urinary symptoms.  No fever.  No aches and pains otherwise.  Apparently at the primary care physician's office the urine showed a trace blood.  She was sent here for further evaluation for CAT scan for stone.  No history of stones.   2:25 PM Patient is very well-appearing.  She has no reproducible pain.  Her CAT scan is normal for kidney stone.  We will get baseline labs to make sure patient does not have liver injury or gallbladder issue.  Otherwise patient appears very well and has very few  symptoms.  I think that the pain is likely muscular skeletal nature given that it only happens with certain movements.  3:27 PM Patient's CAT scan is normal.  Labs are reassuring.  Urine shows only trace leuks, not a lot of whites, trace hemoglobin.  Do not think this represents a urinary tract infection in the absence of fever, symptoms.  I sent urine for culture.  Patient knows to return with any increasing symptoms, follow-up with her primary care physician.  Final Clinical Impressions(s) / ED Diagnoses   Final diagnoses:  None    ED Discharge Orders    None       Jomayra Novitsky, Fredia Sorrow, MD 02/20/18 1527

## 2018-02-20 NOTE — ED Notes (Signed)
ED Provider at bedside. 

## 2018-02-20 NOTE — ED Triage Notes (Signed)
Pt c/o sudden onset  right flank pain x 2 days  And hematuria , sent here from PMD office for eval

## 2018-02-21 LAB — URINE CULTURE: Culture: NO GROWTH

## 2018-06-03 DIAGNOSIS — Z1211 Encounter for screening for malignant neoplasm of colon: Secondary | ICD-10-CM | POA: Diagnosis not present

## 2018-06-03 DIAGNOSIS — Z91048 Other nonmedicinal substance allergy status: Secondary | ICD-10-CM | POA: Diagnosis not present

## 2018-06-03 DIAGNOSIS — Z1159 Encounter for screening for other viral diseases: Secondary | ICD-10-CM | POA: Diagnosis not present

## 2018-06-03 DIAGNOSIS — Z91018 Allergy to other foods: Secondary | ICD-10-CM | POA: Diagnosis not present

## 2018-06-03 DIAGNOSIS — E559 Vitamin D deficiency, unspecified: Secondary | ICD-10-CM | POA: Diagnosis not present

## 2018-06-03 DIAGNOSIS — R030 Elevated blood-pressure reading, without diagnosis of hypertension: Secondary | ICD-10-CM | POA: Diagnosis not present

## 2018-06-03 DIAGNOSIS — Z131 Encounter for screening for diabetes mellitus: Secondary | ICD-10-CM | POA: Diagnosis not present

## 2018-06-03 DIAGNOSIS — Z13220 Encounter for screening for lipoid disorders: Secondary | ICD-10-CM | POA: Diagnosis not present

## 2018-06-04 DIAGNOSIS — H40053 Ocular hypertension, bilateral: Secondary | ICD-10-CM | POA: Diagnosis not present

## 2018-06-04 DIAGNOSIS — H1013 Acute atopic conjunctivitis, bilateral: Secondary | ICD-10-CM | POA: Diagnosis not present

## 2018-06-04 DIAGNOSIS — H04123 Dry eye syndrome of bilateral lacrimal glands: Secondary | ICD-10-CM | POA: Diagnosis not present

## 2018-06-04 DIAGNOSIS — H40013 Open angle with borderline findings, low risk, bilateral: Secondary | ICD-10-CM | POA: Diagnosis not present

## 2018-06-17 DIAGNOSIS — Z1211 Encounter for screening for malignant neoplasm of colon: Secondary | ICD-10-CM | POA: Diagnosis not present

## 2018-06-17 DIAGNOSIS — R197 Diarrhea, unspecified: Secondary | ICD-10-CM | POA: Diagnosis not present

## 2018-06-17 DIAGNOSIS — M1990 Unspecified osteoarthritis, unspecified site: Secondary | ICD-10-CM | POA: Diagnosis not present

## 2018-07-02 DIAGNOSIS — D124 Benign neoplasm of descending colon: Secondary | ICD-10-CM | POA: Diagnosis not present

## 2018-07-02 DIAGNOSIS — Z1211 Encounter for screening for malignant neoplasm of colon: Secondary | ICD-10-CM | POA: Diagnosis not present

## 2018-07-02 DIAGNOSIS — K635 Polyp of colon: Secondary | ICD-10-CM | POA: Diagnosis not present

## 2018-09-26 IMAGING — CT CT RENAL STONE PROTOCOL
2 of 4 series · 17 of 46 positions shown, 19 images · non-contrast
Comparison: None.

CLINICAL DATA: Flank pain with stone disease suspected. Right flank
pain and microscopic hematuria

EXAM:
CT ABDOMEN AND PELVIS WITHOUT CONTRAST
TECHNIQUE: Multidetector CT imaging of the abdomen and pelvis was performed
following the standard protocol without IV contrast.

[Series 2: axial st · axial · 0.88mm/px · z∈[-381,+19]mm · 14 of 88 slices shown, 16 images]
[im 4/88  soft-tissue]
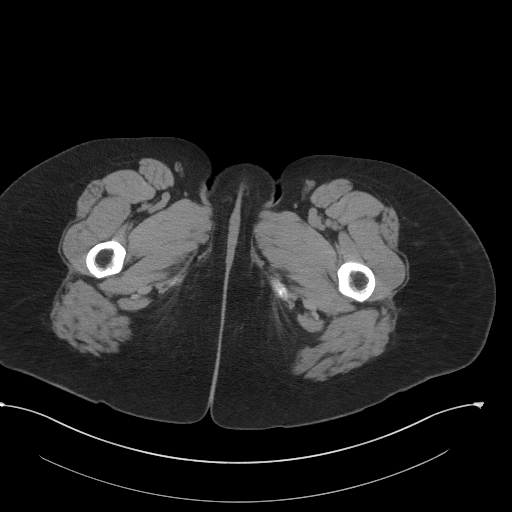
[im 4/88  bone]
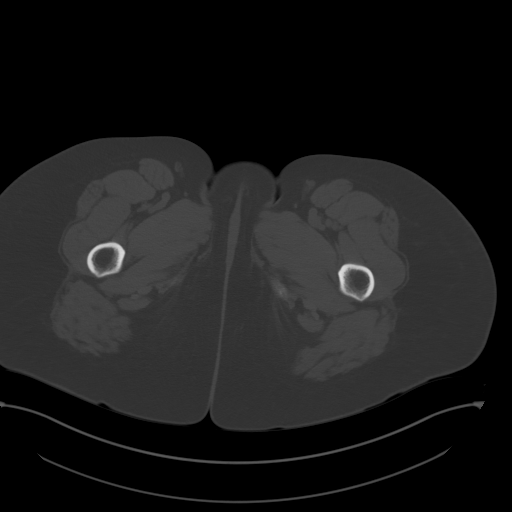
[im 12/88  soft-tissue]
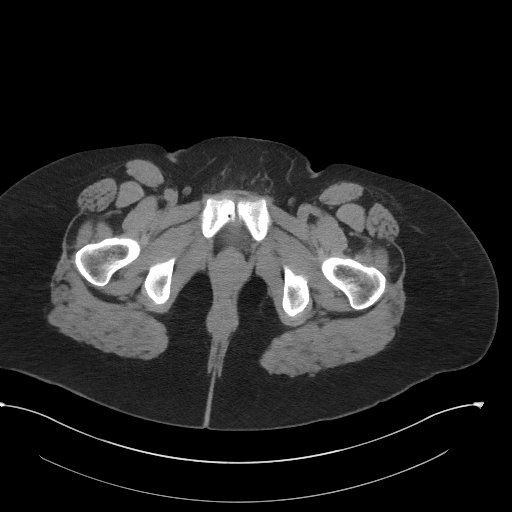
[im 16/88  soft-tissue]
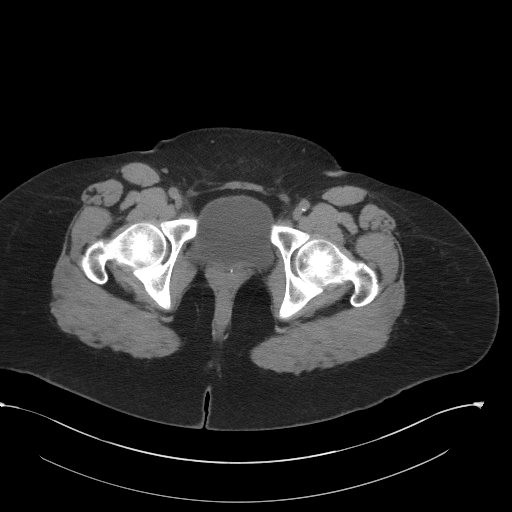
[im 23/88  soft-tissue]
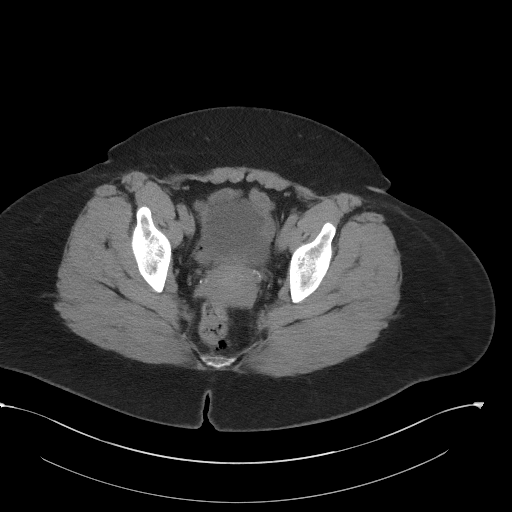
[im 31/88  soft-tissue]
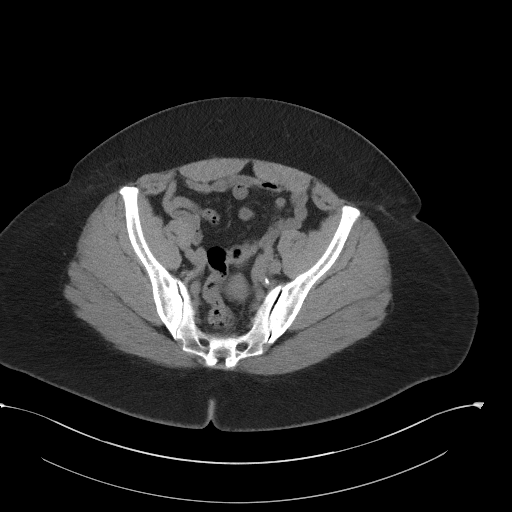
[im 35/88  soft-tissue]
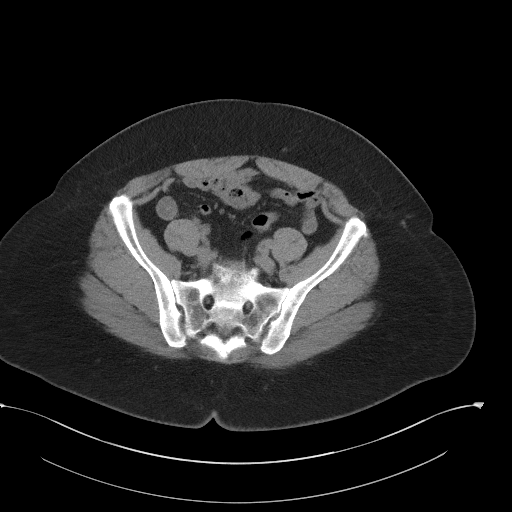
[im 42/88  soft-tissue]
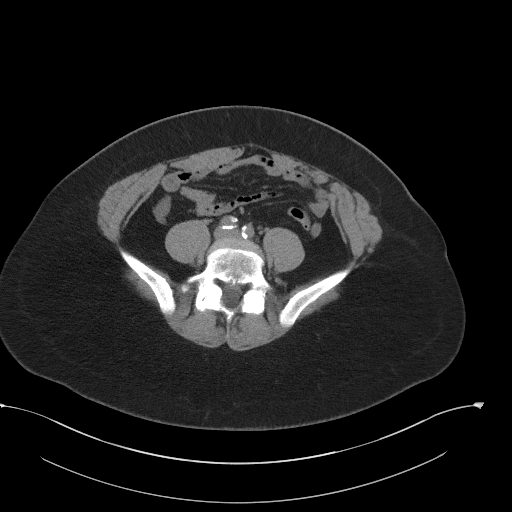
[im 46/88  soft-tissue]
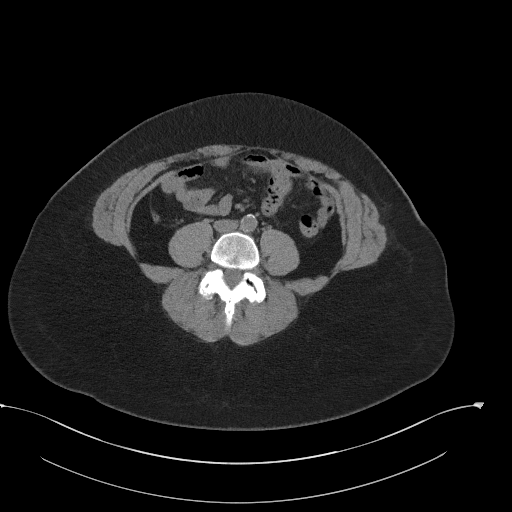
[im 53/88  soft-tissue]
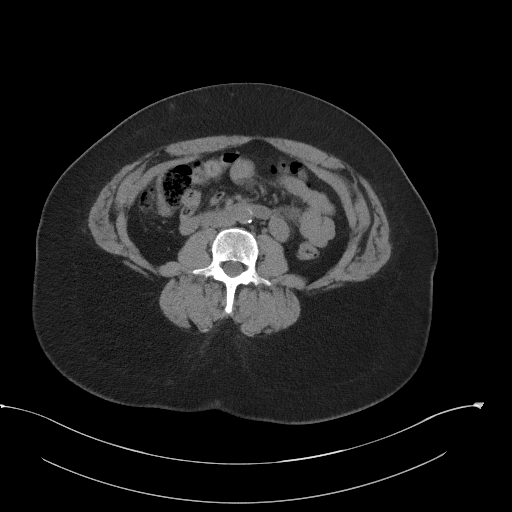
[im 53/88  bone]
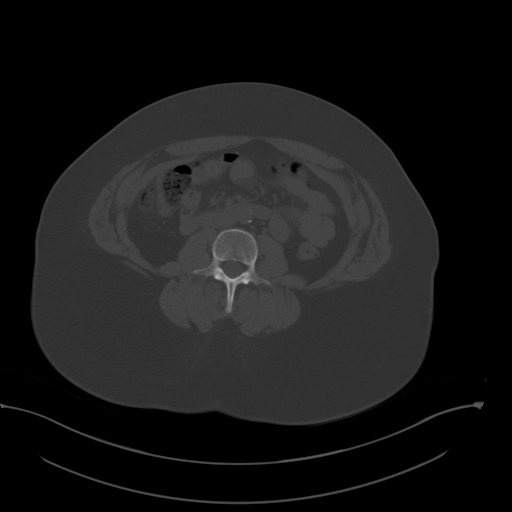
[im 57/88  soft-tissue]
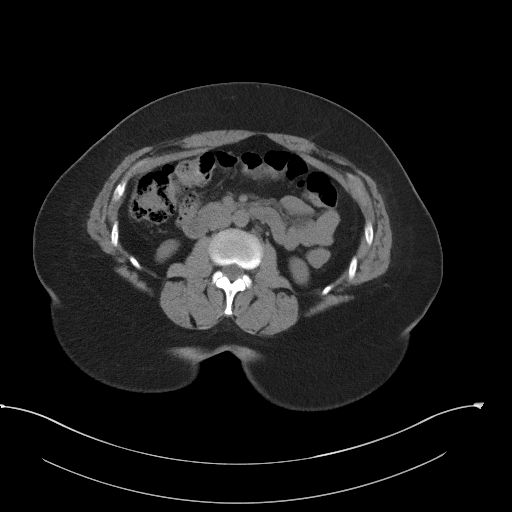
[im 65/88  soft-tissue]
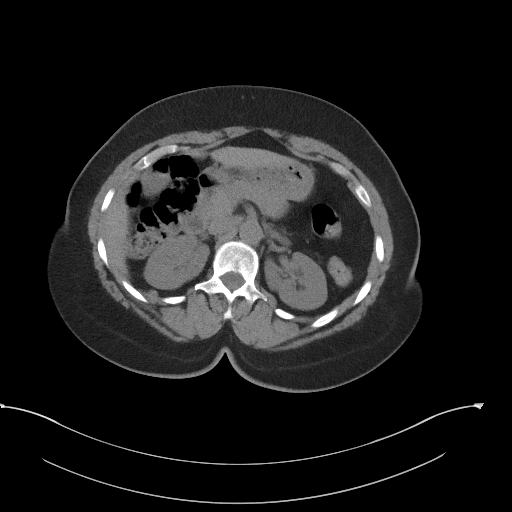
[im 72/88  soft-tissue]
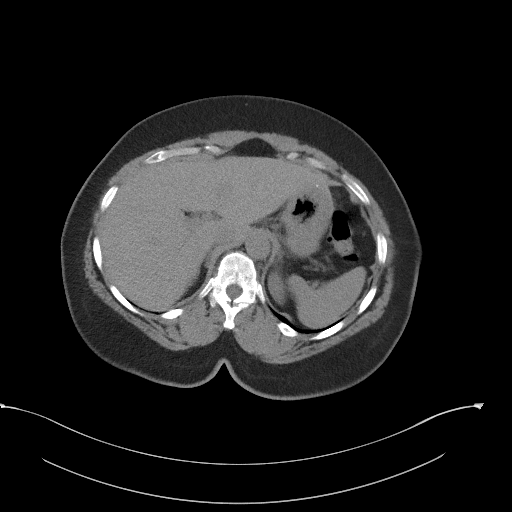
[im 76/88  soft-tissue]
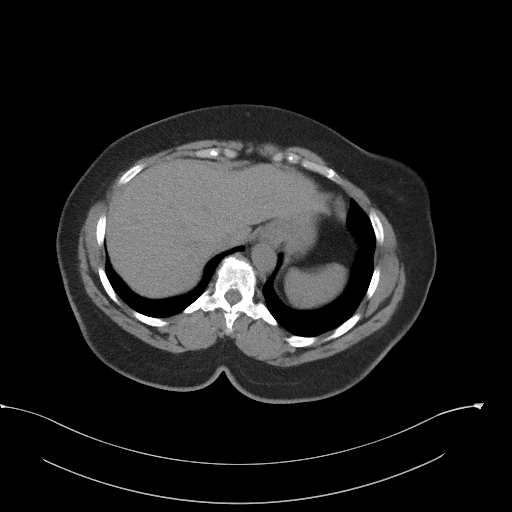
[im 84/88  soft-tissue]
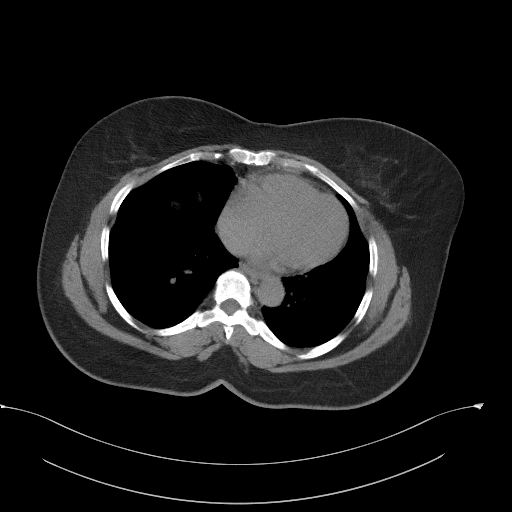

[Series 5: coronal st · coronal · 0.88mm/px · 3 of 102 slices shown]
[im 34/102  soft-tissue]
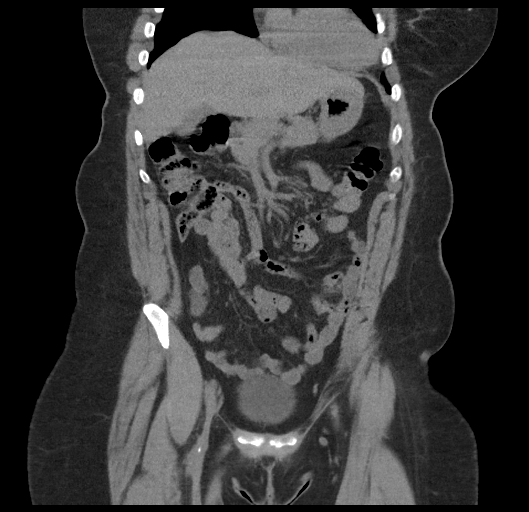
[im 45/102  soft-tissue]
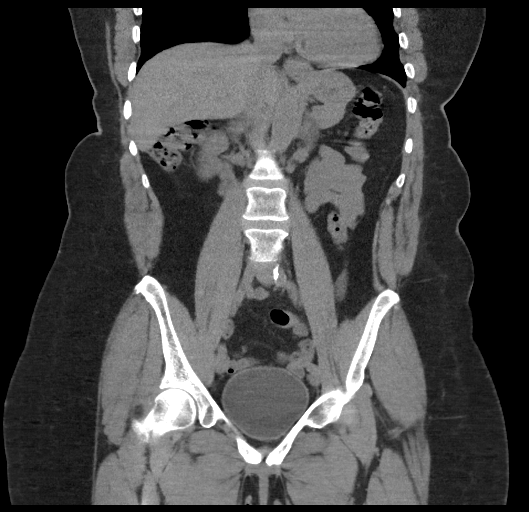
[im 57/102  soft-tissue]
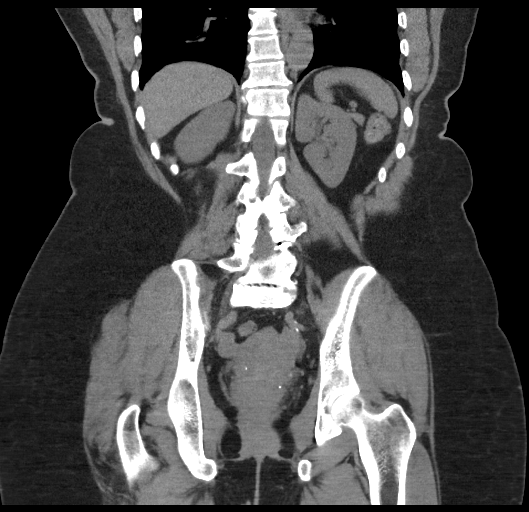

[17 of 46 positions shown; findings below may reference images not displayed]

FINDINGS: Lower chest:  No contributory findings.

Hepatobiliary: No focal abnormality when accounting for scan
heterogeneity.No evidence of biliary obstruction or stone.

Pancreas: Unremarkable.

Spleen: Unremarkable.

Adrenals/Urinary Tract: Negative adrenals. No hydronephrosis or
stone. Unremarkable bladder.

Stomach/Bowel:  No obstruction. No appendicitis.

Vascular/Lymphatic: No acute vascular abnormality. Moderate
atherosclerotic calcification of the aorta and iliacs. No mass or
adenopathy.

Reproductive:Probable anterior uterine fibroid measuring 18 mm.

Other: No ascites or pneumoperitoneum.

Musculoskeletal: No acute abnormalities. Osteitis pubis. L4-5 and
L5-S1 facet arthropathy with slight L4-5 anterolisthesis. Advanced
L5-S1 disc degeneration.
IMPRESSION: 1. No acute finding. No hydronephrosis or urolithiasis. No
appendicitis.
2.  Aortic Atherosclerosis ([id number]).
3. Probable small uterine fibroid.

## 2018-11-05 DIAGNOSIS — H04123 Dry eye syndrome of bilateral lacrimal glands: Secondary | ICD-10-CM | POA: Diagnosis not present

## 2018-11-05 DIAGNOSIS — H40013 Open angle with borderline findings, low risk, bilateral: Secondary | ICD-10-CM | POA: Diagnosis not present

## 2018-11-05 DIAGNOSIS — H40053 Ocular hypertension, bilateral: Secondary | ICD-10-CM | POA: Diagnosis not present

## 2018-11-05 DIAGNOSIS — H1013 Acute atopic conjunctivitis, bilateral: Secondary | ICD-10-CM | POA: Diagnosis not present

## 2019-05-06 DIAGNOSIS — H40013 Open angle with borderline findings, low risk, bilateral: Secondary | ICD-10-CM | POA: Diagnosis not present

## 2019-06-19 DIAGNOSIS — H40013 Open angle with borderline findings, low risk, bilateral: Secondary | ICD-10-CM | POA: Diagnosis not present

## 2019-06-19 DIAGNOSIS — H25013 Cortical age-related cataract, bilateral: Secondary | ICD-10-CM | POA: Diagnosis not present

## 2019-06-19 DIAGNOSIS — H2513 Age-related nuclear cataract, bilateral: Secondary | ICD-10-CM | POA: Diagnosis not present

## 2019-06-19 DIAGNOSIS — H04123 Dry eye syndrome of bilateral lacrimal glands: Secondary | ICD-10-CM | POA: Diagnosis not present

## 2019-08-11 DIAGNOSIS — I10 Essential (primary) hypertension: Secondary | ICD-10-CM | POA: Diagnosis not present

## 2019-08-22 DIAGNOSIS — Z03818 Encounter for observation for suspected exposure to other biological agents ruled out: Secondary | ICD-10-CM | POA: Diagnosis not present

## 2019-09-08 DIAGNOSIS — I10 Essential (primary) hypertension: Secondary | ICD-10-CM | POA: Diagnosis not present

## 2019-09-08 DIAGNOSIS — Z79899 Other long term (current) drug therapy: Secondary | ICD-10-CM | POA: Diagnosis not present

## 2019-09-16 DIAGNOSIS — Z79899 Other long term (current) drug therapy: Secondary | ICD-10-CM | POA: Diagnosis not present

## 2019-11-24 DIAGNOSIS — H40023 Open angle with borderline findings, high risk, bilateral: Secondary | ICD-10-CM | POA: Diagnosis not present

## 2019-11-24 DIAGNOSIS — H25013 Cortical age-related cataract, bilateral: Secondary | ICD-10-CM | POA: Diagnosis not present

## 2019-11-24 DIAGNOSIS — H2513 Age-related nuclear cataract, bilateral: Secondary | ICD-10-CM | POA: Diagnosis not present

## 2019-11-24 DIAGNOSIS — H40053 Ocular hypertension, bilateral: Secondary | ICD-10-CM | POA: Diagnosis not present

## 2019-11-28 DIAGNOSIS — Z01419 Encounter for gynecological examination (general) (routine) without abnormal findings: Secondary | ICD-10-CM | POA: Diagnosis not present

## 2019-11-28 DIAGNOSIS — Z1231 Encounter for screening mammogram for malignant neoplasm of breast: Secondary | ICD-10-CM | POA: Diagnosis not present

## 2019-11-28 DIAGNOSIS — Z6834 Body mass index (BMI) 34.0-34.9, adult: Secondary | ICD-10-CM | POA: Diagnosis not present

## 2019-11-28 DIAGNOSIS — Z124 Encounter for screening for malignant neoplasm of cervix: Secondary | ICD-10-CM | POA: Diagnosis not present

## 2020-01-06 DIAGNOSIS — E559 Vitamin D deficiency, unspecified: Secondary | ICD-10-CM | POA: Diagnosis not present

## 2020-01-06 DIAGNOSIS — Z91048 Other nonmedicinal substance allergy status: Secondary | ICD-10-CM | POA: Diagnosis not present

## 2020-01-06 DIAGNOSIS — Z91018 Allergy to other foods: Secondary | ICD-10-CM | POA: Diagnosis not present

## 2020-01-06 DIAGNOSIS — Z13220 Encounter for screening for lipoid disorders: Secondary | ICD-10-CM | POA: Diagnosis not present

## 2020-01-06 DIAGNOSIS — Z131 Encounter for screening for diabetes mellitus: Secondary | ICD-10-CM | POA: Diagnosis not present

## 2020-01-06 DIAGNOSIS — I10 Essential (primary) hypertension: Secondary | ICD-10-CM | POA: Diagnosis not present

## 2020-01-13 DIAGNOSIS — T783XXD Angioneurotic edema, subsequent encounter: Secondary | ICD-10-CM | POA: Diagnosis not present

## 2020-01-13 DIAGNOSIS — J301 Allergic rhinitis due to pollen: Secondary | ICD-10-CM | POA: Diagnosis not present

## 2020-01-13 DIAGNOSIS — J3081 Allergic rhinitis due to animal (cat) (dog) hair and dander: Secondary | ICD-10-CM | POA: Diagnosis not present

## 2020-01-13 DIAGNOSIS — J3089 Other allergic rhinitis: Secondary | ICD-10-CM | POA: Diagnosis not present

## 2020-06-01 DIAGNOSIS — H40033 Anatomical narrow angle, bilateral: Secondary | ICD-10-CM | POA: Diagnosis not present

## 2020-06-01 DIAGNOSIS — H40023 Open angle with borderline findings, high risk, bilateral: Secondary | ICD-10-CM | POA: Diagnosis not present

## 2020-06-01 DIAGNOSIS — H40053 Ocular hypertension, bilateral: Secondary | ICD-10-CM | POA: Diagnosis not present

## 2020-08-19 DIAGNOSIS — Z79899 Other long term (current) drug therapy: Secondary | ICD-10-CM | POA: Diagnosis not present

## 2020-08-19 DIAGNOSIS — Z91048 Other nonmedicinal substance allergy status: Secondary | ICD-10-CM | POA: Diagnosis not present

## 2020-08-19 DIAGNOSIS — I10 Essential (primary) hypertension: Secondary | ICD-10-CM | POA: Diagnosis not present

## 2020-08-19 DIAGNOSIS — Z131 Encounter for screening for diabetes mellitus: Secondary | ICD-10-CM | POA: Diagnosis not present

## 2020-08-19 DIAGNOSIS — M8580 Other specified disorders of bone density and structure, unspecified site: Secondary | ICD-10-CM | POA: Diagnosis not present

## 2020-08-19 DIAGNOSIS — E559 Vitamin D deficiency, unspecified: Secondary | ICD-10-CM | POA: Diagnosis not present

## 2020-08-19 DIAGNOSIS — Z136 Encounter for screening for cardiovascular disorders: Secondary | ICD-10-CM | POA: Diagnosis not present

## 2020-10-14 ENCOUNTER — Other Ambulatory Visit: Payer: PPO

## 2020-10-14 DIAGNOSIS — Z20822 Contact with and (suspected) exposure to covid-19: Secondary | ICD-10-CM

## 2020-10-16 LAB — NOVEL CORONAVIRUS, NAA: SARS-CoV-2, NAA: NOT DETECTED

## 2020-10-16 LAB — SARS-COV-2, NAA 2 DAY TAT

## 2020-10-29 DIAGNOSIS — Z03818 Encounter for observation for suspected exposure to other biological agents ruled out: Secondary | ICD-10-CM | POA: Diagnosis not present

## 2020-12-02 DIAGNOSIS — H40033 Anatomical narrow angle, bilateral: Secondary | ICD-10-CM | POA: Diagnosis not present

## 2020-12-02 DIAGNOSIS — H524 Presbyopia: Secondary | ICD-10-CM | POA: Diagnosis not present

## 2020-12-02 DIAGNOSIS — H40023 Open angle with borderline findings, high risk, bilateral: Secondary | ICD-10-CM | POA: Diagnosis not present

## 2020-12-02 DIAGNOSIS — H40053 Ocular hypertension, bilateral: Secondary | ICD-10-CM | POA: Diagnosis not present

## 2020-12-02 DIAGNOSIS — H2513 Age-related nuclear cataract, bilateral: Secondary | ICD-10-CM | POA: Diagnosis not present

## 2021-01-14 DIAGNOSIS — R319 Hematuria, unspecified: Secondary | ICD-10-CM | POA: Diagnosis not present

## 2021-01-28 DIAGNOSIS — Z124 Encounter for screening for malignant neoplasm of cervix: Secondary | ICD-10-CM | POA: Diagnosis not present

## 2021-01-28 DIAGNOSIS — Z01419 Encounter for gynecological examination (general) (routine) without abnormal findings: Secondary | ICD-10-CM | POA: Diagnosis not present

## 2021-01-28 DIAGNOSIS — Z6832 Body mass index (BMI) 32.0-32.9, adult: Secondary | ICD-10-CM | POA: Diagnosis not present

## 2021-01-28 DIAGNOSIS — Z1231 Encounter for screening mammogram for malignant neoplasm of breast: Secondary | ICD-10-CM | POA: Diagnosis not present

## 2021-05-26 DIAGNOSIS — R051 Acute cough: Secondary | ICD-10-CM | POA: Diagnosis not present

## 2021-05-26 DIAGNOSIS — N1831 Chronic kidney disease, stage 3a: Secondary | ICD-10-CM | POA: Diagnosis not present

## 2021-05-26 DIAGNOSIS — R7303 Prediabetes: Secondary | ICD-10-CM | POA: Diagnosis not present

## 2021-05-26 DIAGNOSIS — U071 COVID-19: Secondary | ICD-10-CM | POA: Diagnosis not present

## 2021-05-26 DIAGNOSIS — I10 Essential (primary) hypertension: Secondary | ICD-10-CM | POA: Diagnosis not present

## 2021-06-08 DIAGNOSIS — Z23 Encounter for immunization: Secondary | ICD-10-CM | POA: Diagnosis not present

## 2021-06-23 DIAGNOSIS — H40023 Open angle with borderline findings, high risk, bilateral: Secondary | ICD-10-CM | POA: Diagnosis not present

## 2021-06-23 DIAGNOSIS — H40053 Ocular hypertension, bilateral: Secondary | ICD-10-CM | POA: Diagnosis not present

## 2021-06-23 DIAGNOSIS — H40033 Anatomical narrow angle, bilateral: Secondary | ICD-10-CM | POA: Diagnosis not present

## 2021-07-20 DIAGNOSIS — M25562 Pain in left knee: Secondary | ICD-10-CM | POA: Diagnosis not present

## 2021-07-20 DIAGNOSIS — E669 Obesity, unspecified: Secondary | ICD-10-CM | POA: Diagnosis not present

## 2021-07-20 DIAGNOSIS — R7303 Prediabetes: Secondary | ICD-10-CM | POA: Diagnosis not present

## 2021-07-20 DIAGNOSIS — E7800 Pure hypercholesterolemia, unspecified: Secondary | ICD-10-CM | POA: Diagnosis not present

## 2021-07-20 DIAGNOSIS — I10 Essential (primary) hypertension: Secondary | ICD-10-CM | POA: Diagnosis not present

## 2021-07-20 DIAGNOSIS — E559 Vitamin D deficiency, unspecified: Secondary | ICD-10-CM | POA: Diagnosis not present

## 2021-07-20 DIAGNOSIS — N1831 Chronic kidney disease, stage 3a: Secondary | ICD-10-CM | POA: Diagnosis not present

## 2021-07-20 DIAGNOSIS — Z91048 Other nonmedicinal substance allergy status: Secondary | ICD-10-CM | POA: Diagnosis not present

## 2021-07-22 DIAGNOSIS — Z23 Encounter for immunization: Secondary | ICD-10-CM | POA: Diagnosis not present

## 2021-08-01 DIAGNOSIS — M25562 Pain in left knee: Secondary | ICD-10-CM | POA: Diagnosis not present

## 2021-08-02 ENCOUNTER — Other Ambulatory Visit: Payer: Self-pay | Admitting: Sports Medicine

## 2021-08-02 ENCOUNTER — Ambulatory Visit
Admission: RE | Admit: 2021-08-02 | Discharge: 2021-08-02 | Disposition: A | Discharge status: Home / Self Care | Payer: PPO | Source: Ambulatory Visit | Attending: Sports Medicine | Admitting: Sports Medicine

## 2021-08-02 DIAGNOSIS — M25562 Pain in left knee: Secondary | ICD-10-CM | POA: Diagnosis not present

## 2021-08-29 DIAGNOSIS — R7303 Prediabetes: Secondary | ICD-10-CM | POA: Diagnosis not present

## 2021-08-29 DIAGNOSIS — E559 Vitamin D deficiency, unspecified: Secondary | ICD-10-CM | POA: Diagnosis not present

## 2021-08-29 DIAGNOSIS — M1712 Unilateral primary osteoarthritis, left knee: Secondary | ICD-10-CM | POA: Diagnosis not present

## 2021-08-29 DIAGNOSIS — E7800 Pure hypercholesterolemia, unspecified: Secondary | ICD-10-CM | POA: Diagnosis not present

## 2021-09-12 DIAGNOSIS — M79652 Pain in left thigh: Secondary | ICD-10-CM | POA: Diagnosis not present

## 2021-09-12 DIAGNOSIS — M25562 Pain in left knee: Secondary | ICD-10-CM | POA: Diagnosis not present

## 2021-09-12 DIAGNOSIS — R531 Weakness: Secondary | ICD-10-CM | POA: Diagnosis not present

## 2021-09-12 DIAGNOSIS — R262 Difficulty in walking, not elsewhere classified: Secondary | ICD-10-CM | POA: Diagnosis not present

## 2021-09-19 DIAGNOSIS — M25562 Pain in left knee: Secondary | ICD-10-CM | POA: Diagnosis not present

## 2021-09-19 DIAGNOSIS — M79652 Pain in left thigh: Secondary | ICD-10-CM | POA: Diagnosis not present

## 2021-09-19 DIAGNOSIS — R531 Weakness: Secondary | ICD-10-CM | POA: Diagnosis not present

## 2021-09-19 DIAGNOSIS — R262 Difficulty in walking, not elsewhere classified: Secondary | ICD-10-CM | POA: Diagnosis not present

## 2021-09-21 DIAGNOSIS — M79652 Pain in left thigh: Secondary | ICD-10-CM | POA: Diagnosis not present

## 2021-09-21 DIAGNOSIS — M25562 Pain in left knee: Secondary | ICD-10-CM | POA: Diagnosis not present

## 2021-09-21 DIAGNOSIS — R262 Difficulty in walking, not elsewhere classified: Secondary | ICD-10-CM | POA: Diagnosis not present

## 2021-09-21 DIAGNOSIS — R531 Weakness: Secondary | ICD-10-CM | POA: Diagnosis not present

## 2021-09-26 DIAGNOSIS — M25562 Pain in left knee: Secondary | ICD-10-CM | POA: Diagnosis not present

## 2021-09-26 DIAGNOSIS — M79652 Pain in left thigh: Secondary | ICD-10-CM | POA: Diagnosis not present

## 2021-09-26 DIAGNOSIS — R531 Weakness: Secondary | ICD-10-CM | POA: Diagnosis not present

## 2021-09-26 DIAGNOSIS — R262 Difficulty in walking, not elsewhere classified: Secondary | ICD-10-CM | POA: Diagnosis not present

## 2021-09-28 DIAGNOSIS — R531 Weakness: Secondary | ICD-10-CM | POA: Diagnosis not present

## 2021-09-28 DIAGNOSIS — M79652 Pain in left thigh: Secondary | ICD-10-CM | POA: Diagnosis not present

## 2021-09-28 DIAGNOSIS — R262 Difficulty in walking, not elsewhere classified: Secondary | ICD-10-CM | POA: Diagnosis not present

## 2021-09-28 DIAGNOSIS — M25562 Pain in left knee: Secondary | ICD-10-CM | POA: Diagnosis not present

## 2021-10-03 DIAGNOSIS — M25562 Pain in left knee: Secondary | ICD-10-CM | POA: Diagnosis not present

## 2021-10-03 DIAGNOSIS — M79652 Pain in left thigh: Secondary | ICD-10-CM | POA: Diagnosis not present

## 2021-10-03 DIAGNOSIS — R531 Weakness: Secondary | ICD-10-CM | POA: Diagnosis not present

## 2021-10-03 DIAGNOSIS — R262 Difficulty in walking, not elsewhere classified: Secondary | ICD-10-CM | POA: Diagnosis not present

## 2021-10-07 DIAGNOSIS — R531 Weakness: Secondary | ICD-10-CM | POA: Diagnosis not present

## 2021-10-07 DIAGNOSIS — M79652 Pain in left thigh: Secondary | ICD-10-CM | POA: Diagnosis not present

## 2021-10-07 DIAGNOSIS — R262 Difficulty in walking, not elsewhere classified: Secondary | ICD-10-CM | POA: Diagnosis not present

## 2021-10-07 DIAGNOSIS — M25562 Pain in left knee: Secondary | ICD-10-CM | POA: Diagnosis not present

## 2021-10-10 DIAGNOSIS — M25562 Pain in left knee: Secondary | ICD-10-CM | POA: Diagnosis not present

## 2021-10-10 DIAGNOSIS — R262 Difficulty in walking, not elsewhere classified: Secondary | ICD-10-CM | POA: Diagnosis not present

## 2021-10-10 DIAGNOSIS — R531 Weakness: Secondary | ICD-10-CM | POA: Diagnosis not present

## 2021-10-10 DIAGNOSIS — M79652 Pain in left thigh: Secondary | ICD-10-CM | POA: Diagnosis not present

## 2021-10-12 DIAGNOSIS — R531 Weakness: Secondary | ICD-10-CM | POA: Diagnosis not present

## 2021-10-12 DIAGNOSIS — R262 Difficulty in walking, not elsewhere classified: Secondary | ICD-10-CM | POA: Diagnosis not present

## 2021-10-12 DIAGNOSIS — M79652 Pain in left thigh: Secondary | ICD-10-CM | POA: Diagnosis not present

## 2021-10-12 DIAGNOSIS — M25562 Pain in left knee: Secondary | ICD-10-CM | POA: Diagnosis not present

## 2021-10-20 DIAGNOSIS — S0990XA Unspecified injury of head, initial encounter: Secondary | ICD-10-CM | POA: Diagnosis not present

## 2021-10-20 DIAGNOSIS — M1712 Unilateral primary osteoarthritis, left knee: Secondary | ICD-10-CM | POA: Diagnosis not present

## 2021-10-20 DIAGNOSIS — M25561 Pain in right knee: Secondary | ICD-10-CM | POA: Diagnosis not present

## 2021-12-26 DIAGNOSIS — H35013 Changes in retinal vascular appearance, bilateral: Secondary | ICD-10-CM | POA: Diagnosis not present

## 2021-12-26 DIAGNOSIS — H40053 Ocular hypertension, bilateral: Secondary | ICD-10-CM | POA: Diagnosis not present

## 2021-12-26 DIAGNOSIS — H25813 Combined forms of age-related cataract, bilateral: Secondary | ICD-10-CM | POA: Diagnosis not present

## 2021-12-26 DIAGNOSIS — H524 Presbyopia: Secondary | ICD-10-CM | POA: Diagnosis not present

## 2021-12-26 DIAGNOSIS — H40023 Open angle with borderline findings, high risk, bilateral: Secondary | ICD-10-CM | POA: Diagnosis not present

## 2022-01-30 DIAGNOSIS — Z124 Encounter for screening for malignant neoplasm of cervix: Secondary | ICD-10-CM | POA: Diagnosis not present

## 2022-01-30 DIAGNOSIS — Z1239 Encounter for other screening for malignant neoplasm of breast: Secondary | ICD-10-CM | POA: Diagnosis not present

## 2022-01-30 DIAGNOSIS — Z1211 Encounter for screening for malignant neoplasm of colon: Secondary | ICD-10-CM | POA: Diagnosis not present

## 2022-01-30 DIAGNOSIS — Z1231 Encounter for screening mammogram for malignant neoplasm of breast: Secondary | ICD-10-CM | POA: Diagnosis not present

## 2022-01-30 DIAGNOSIS — Z01419 Encounter for gynecological examination (general) (routine) without abnormal findings: Secondary | ICD-10-CM | POA: Diagnosis not present

## 2022-01-30 DIAGNOSIS — B009 Herpesviral infection, unspecified: Secondary | ICD-10-CM | POA: Diagnosis not present

## 2022-01-30 DIAGNOSIS — Z6830 Body mass index (BMI) 30.0-30.9, adult: Secondary | ICD-10-CM | POA: Diagnosis not present

## 2022-02-23 DIAGNOSIS — E7800 Pure hypercholesterolemia, unspecified: Secondary | ICD-10-CM | POA: Diagnosis not present

## 2022-03-08 IMAGING — CR DG KNEE 3 VIEWS*L*
3 series · 3 of 3 positions shown · non-contrast
Comparison: None.

CLINICAL DATA: Left knee pain, no known injury, initial encounter

EXAM:
LEFT KNEE - 3 VIEW

[w knee ap left]
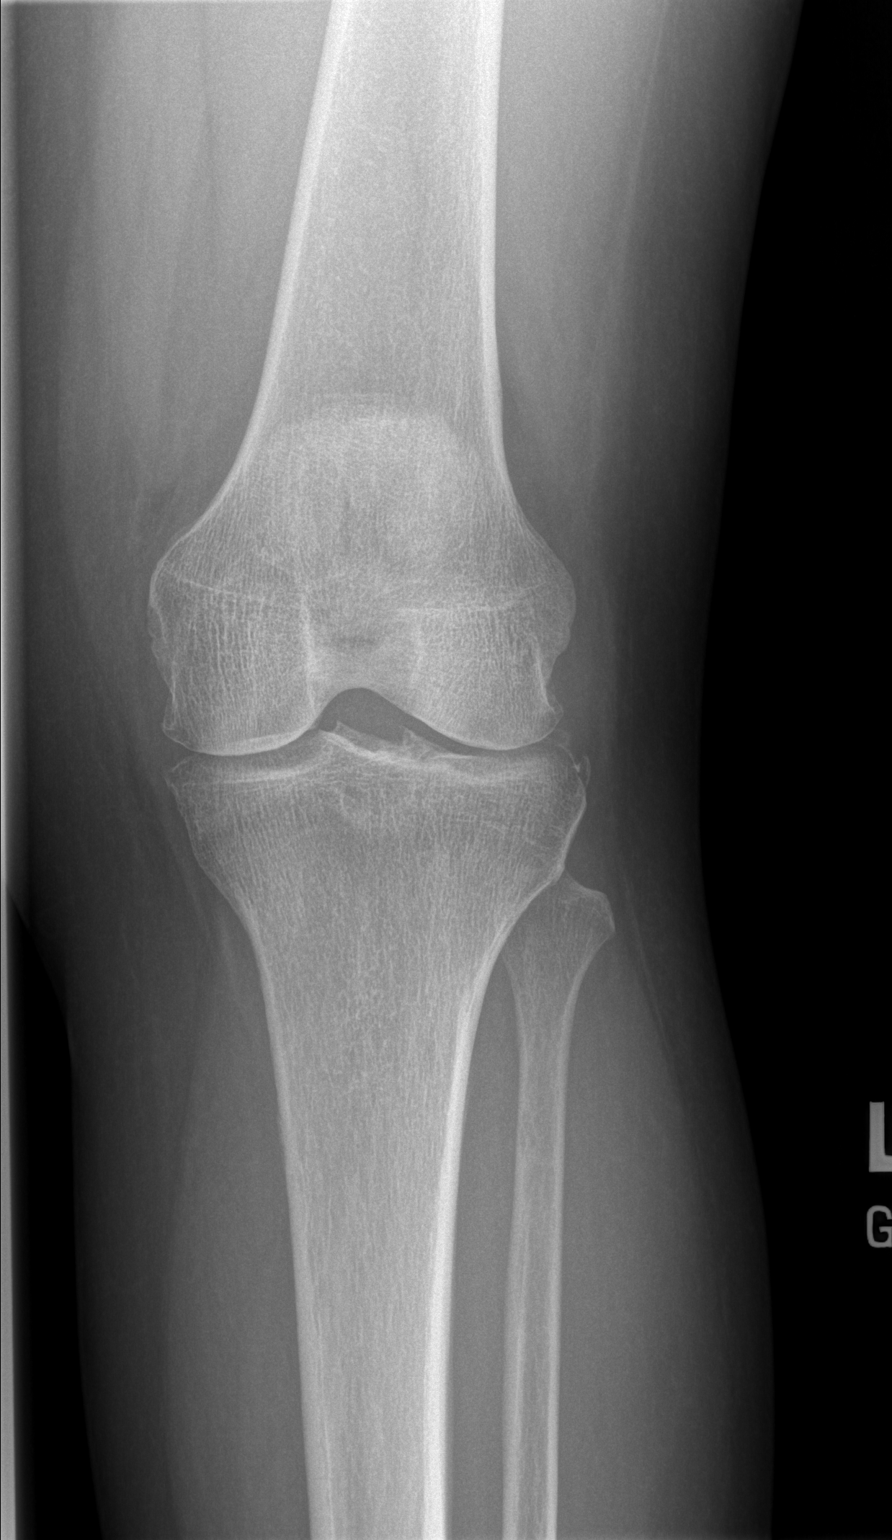

[w knee lat. left]
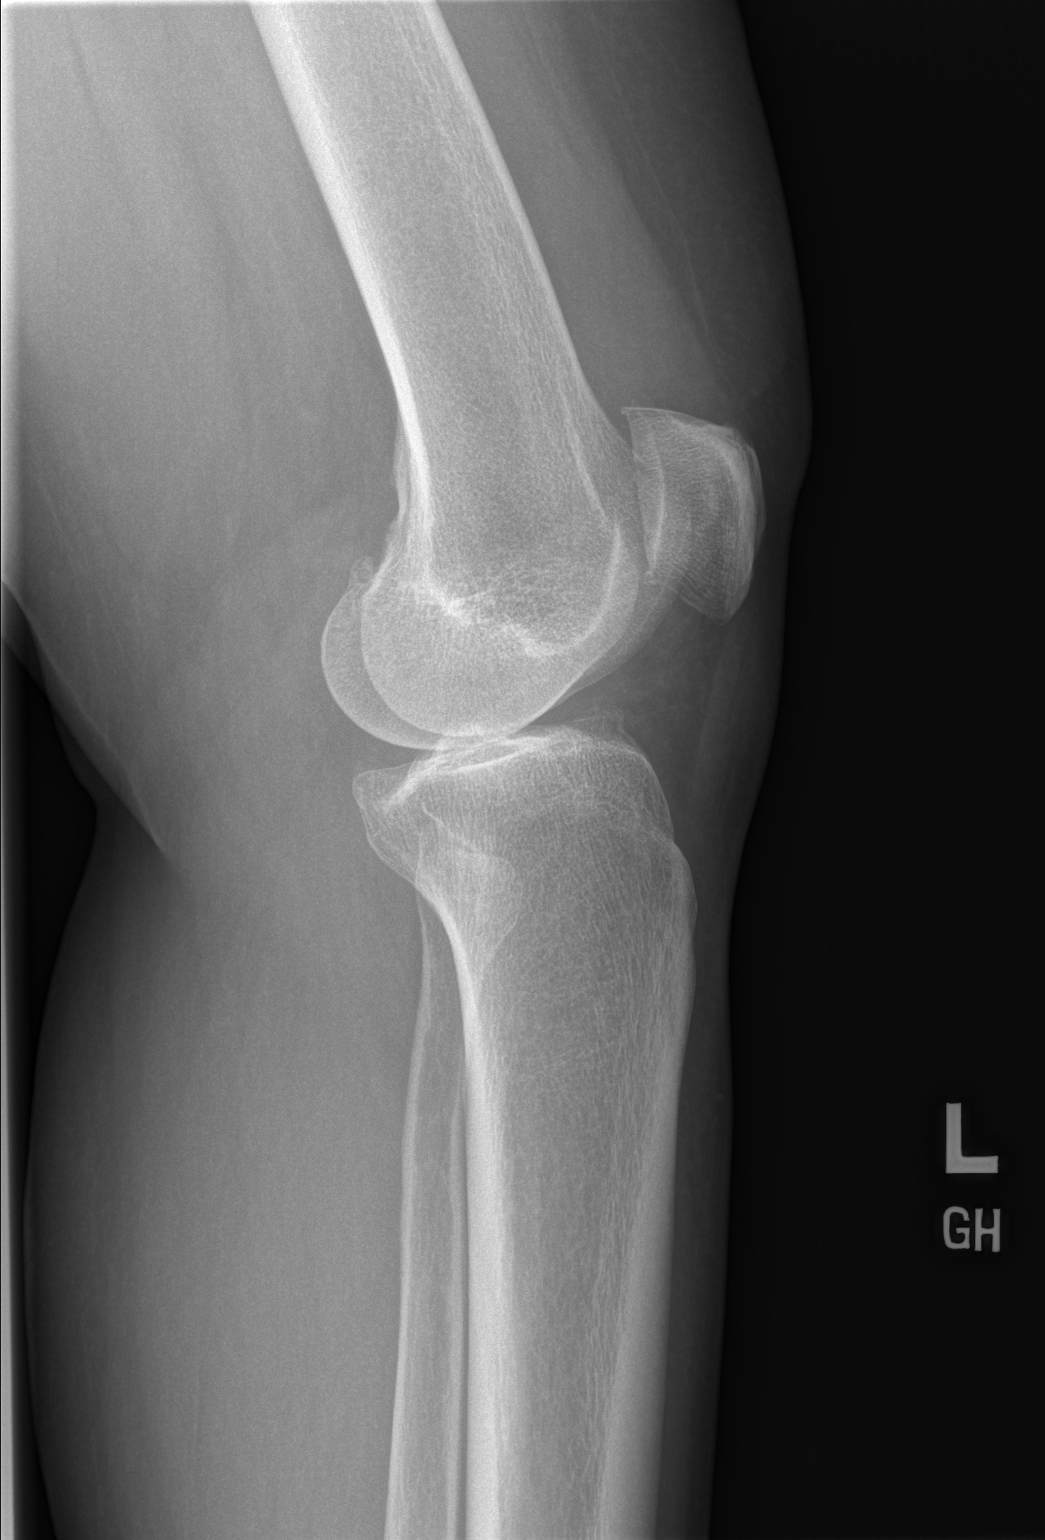

[t patella  left]
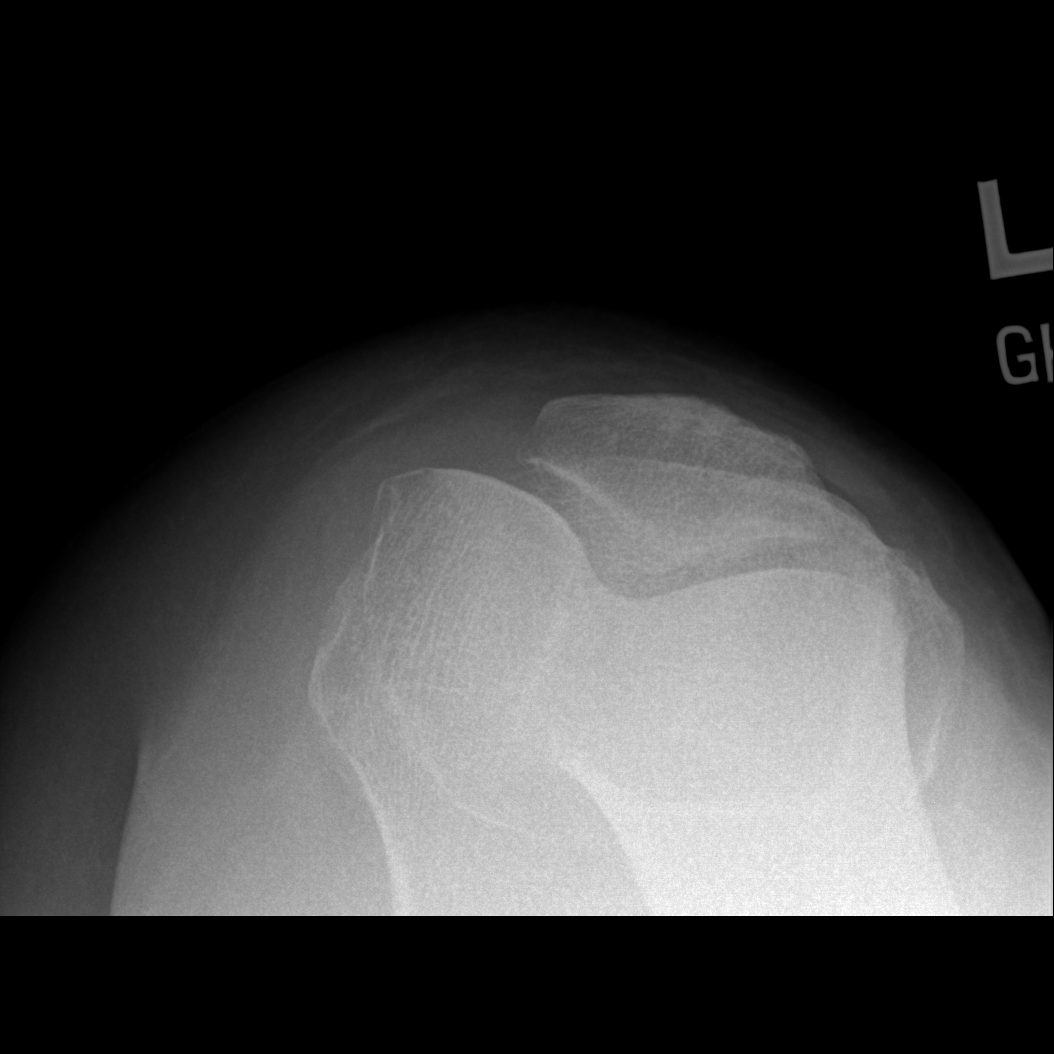

[3 of 3 positions shown; findings below may reference images not displayed]

FINDINGS: Tricompartmental degenerative changes are noted. No joint effusion
is seen. No acute fracture or dislocation is noted.
IMPRESSION: Degenerative change without acute abnormality.

## 2022-07-10 DIAGNOSIS — H40053 Ocular hypertension, bilateral: Secondary | ICD-10-CM | POA: Diagnosis not present

## 2022-07-10 DIAGNOSIS — H40023 Open angle with borderline findings, high risk, bilateral: Secondary | ICD-10-CM | POA: Diagnosis not present

## 2022-09-29 DIAGNOSIS — U071 COVID-19: Secondary | ICD-10-CM | POA: Diagnosis not present

## 2023-01-11 ENCOUNTER — Other Ambulatory Visit: Payer: Self-pay | Admitting: Family Medicine

## 2023-01-11 ENCOUNTER — Telehealth: Payer: Self-pay

## 2023-01-11 DIAGNOSIS — M8580 Other specified disorders of bone density and structure, unspecified site: Secondary | ICD-10-CM

## 2023-01-11 NOTE — Telephone Encounter (Signed)
She had a PREP flyer referral from her physician, dropped off at my office 3/13; called her to confirm class dates, she is available to attend next class at [name] 01/16/23, every T/Th 10-11:15; assessment visit scheduled for 3/18 at 2:30

## 2023-01-15 NOTE — Progress Notes (Signed)
[facility] PREP Evaluation  Patient Details  Name: Christina [facility] MRN: [id number] Date of Birth: 01-06-1951 Age: 72 y.o. PCP: Patient, No Pcp Per  Vitals:   01/15/23 1504  BP: (!) 146/80  Pulse: 82  SpO2: 99%  Weight: 190 lb (86.2 kg)     [facility] Eval - 01/15/23 1500       [facility] "PREP" Location   [facility] "PREP" Location [facility]      Referral    Referring Provider [facility]    Reason for referral Hypertension;High Cholesterol    Program Start Date 01/16/23      Measurement   Waist Circumference 39.5 inches    Hip Circumference 47.5 inches    Body fat 44.5 percent      Information for Trainer   Goals --   EStablish exercise regime; lose 10-15 pounds by end of program   Current Exercise walking    Orthopedic Concerns --   back pain, sciatica   Pertinent Medical History --   HTN, high cholesterol     Mobility and Daily Activities   I find it easy to walk up or down two or more flights of stairs. 4    I have no trouble taking out the trash. 4    I do housework such as vacuuming and dusting on my own without difficulty. 4    I can easily lift a gallon of milk (8lbs). 4    I can easily walk a mile. 4    I have no trouble reaching into high cupboards or reaching down to pick up something from the floor. 4    I do not have trouble doing out-door work such as Armed forces logistics/support/administrative officer, raking leaves, or gardening. 1      Mobility and Daily Activities   I feel younger than my age. 4    I feel independent. 4    I feel energetic. 4    I live an active life.  4    I feel strong. 4    I feel healthy. 4    I feel active as other people my age. 4      How fit and strong are you.   Fit and Strong Total Score 53            Past Medical History:  Diagnosis Date   H/O seasonal allergies    No past surgical history on file. Social History   Tobacco Use  Smoking Status Never  Smokeless Tobacco Never   To begin PREP at [facility] on March 19, every T/Th 10-11:15 [facility] 01/15/2023, 3:10 PM

## 2023-01-16 NOTE — Progress Notes (Signed)
[facility] PREP Weekly Session  Patient Details  Name: Christina [facility] MRN: [id number] Date of Birth: 1951/09/14 Age: 72 y.o. PCP: Patient, No Pcp Per  There were no vitals filed for this visit.   [facility] Weekly seesion - 01/16/23 1100       [facility] "PREP" Location   [facility] "PREP" Location [name]      Weekly Session   Topic Discussed Goal setting and welcome to the program   Introductions, review of notebook, tour of facilty, option to work out on cardio machine   Classes attended to date [place] 01/16/2023, 11:51 AM

## 2023-01-23 NOTE — Progress Notes (Signed)
[facility] PREP Weekly Session  Patient Details  Name: Christina [facility] MRN: [id number] Date of Birth: 1950-12-21 Age: 72 y.o. PCP: Patient, No Pcp Per  Vitals:   01/23/23 1132  Weight: 191 lb (86.6 kg)     [facility] Weekly seesion - 01/23/23 1100       [facility] "PREP" Location   [facility] "PREP" Location [name]      Weekly Session   Topic Discussed Importance of resistance training;Other ways to be active   Sitting no more than 30 minutes: cardio work up to 150 min/wk; strength training 2-3 times a week for 20-40 minutes   Minutes exercised this week 65 minutes    Classes attended to date [place] 01/23/2023, 11:33 AM

## 2023-01-30 NOTE — Progress Notes (Signed)
[facility] PREP Weekly Session  Patient Details  Name: Christina [facility] MRN: [id number] Date of Birth: 1950/12/05 Age: 72 y.o. PCP: Patient, No Pcp Per  Vitals:   01/30/23 1232  Weight: 191 lb (86.6 kg)     [facility] Weekly seesion - 01/30/23 1200       [facility] "PREP" Location   [facility] "PREP" Location [name]      Weekly Session   Topic Discussed Healthy eating tips   Foods to reduce, foods to increase; introduced YUKA app   Minutes exercised this week 60 minutes    Classes attended to date [place] 01/30/2023, 12:33 PM

## 2023-02-06 NOTE — Progress Notes (Signed)
[facility] PREP Weekly Session  Patient Details  Name: Christina [facility] MRN: [id number] Date of Birth: 1951/05/26 Age: 71 y.o. PCP: Patient, No Pcp Per  Vitals:   02/06/23 1308  Weight: 191 lb (86.6 kg)     [facility] Weekly seesion - 02/06/23 1300       [facility] "PREP" Location   [facility] "PREP" Location [name]      Weekly Session   Topic Discussed Health habits   Sugar demo   Minutes exercised this week 190 minutes    Classes attended to date 7             Alyssandra Hulsebus B Jessika Rothery 02/06/2023, 1:09 PM

## 2023-02-13 NOTE — Progress Notes (Signed)
[facility] PREP Weekly Session  Patient Details  Name: Christina [facility] MRN: [id number] Date of Birth: December 13, 1950 Age: 72 y.o. PCP: Patient, No Pcp Per  Vitals:   02/13/23 1140  Weight: 188 lb 3.2 oz (85.4 kg)     [facility] Weekly seesion - 02/13/23 1100       [facility] "PREP" Location   [facility] "PREP" Location [name]      Weekly Session   Topic Discussed Restaurant Eating   salt demo; limit salt intake to 1550-2300 mg/day   Minutes exercised this week 240.2 minutes    Classes attended to date 66             Shan Padgett B Veronica Fretz 02/13/2023, 11:41 AM

## 2023-02-20 NOTE — Progress Notes (Signed)
[facility] PREP Weekly Session  Patient Details  Name: Christina [facility] MRN: [id number] Date of Birth: 1951/05/08 Age: 72 y.o. PCP: Patient, No Pcp Per  Vitals:   02/20/23 1111  Weight: 192 lb 6.4 oz (87.3 kg)     [facility] Weekly seesion - 02/20/23 1100       [facility] "PREP" Location   [facility] "PREP" Location [name]      Weekly Session   Topic Discussed Stress management and problem solving   finger tip mudra breathwork; importance of sleep: 7-9 hrs/night   Minutes exercised this week 235 minutes    Classes attended to date 80             Christina [facility] 02/20/2023, 11:11 AM

## 2023-02-27 NOTE — Progress Notes (Signed)
[facility] PREP Weekly Session  Patient Details  Name: Christina [facility] MRN: [id number] Date of Birth: [birth year] Age: 72 y.o. PCP: Patient, No Pcp Per  Vitals:   02/27/23 1117  Weight: 188 lb 12.8 oz (85.6 kg)     [facility] Weekly seesion - 02/27/23 1100       [facility] "PREP" Location   [facility] "PREP" Location [name]      Weekly Session   Topic Discussed Expectations and non-scale victories   Halfway through program, revisit goals; staying positive   Minutes exercised this week 180 minutes    Classes attended to date 2             Jcion Buddenhagen B Khamila Bassinger 02/27/2023, 11:17 AM

## 2023-03-06 NOTE — Progress Notes (Signed)
[facility] PREP Weekly Session  Patient Details  Name: Christina [facility] MRN: [id number] Date of Birth: 28-Jan-1951 Age: 72 y.o. PCP: Patient, No Pcp Per  Vitals:   03/06/23 1116  Weight: 189 lb (85.7 kg)     [facility] Weekly seesion - 03/06/23 1100       [facility] "PREP" Location   [facility] "PREP" Location [name]      Weekly Session   Topic Discussed Other   Portion control; visualize portion size demo; review of food label red sugar craisins.   Minutes exercised this week 300 minutes    Classes attended to date 34             Maansi Wike B Kobie Whidby 03/06/2023, 11:16 AM

## 2023-03-13 NOTE — Progress Notes (Signed)
[facility] PREP Weekly Session  Patient Details  Name: DARBIE DUCHENE MRN: [id number] Date of Birth: August 31, 1951 Age: 72 y.o. PCP: Patient, No Pcp Per  Vitals:   03/13/23 1117  Weight: 188 lb (85.3 kg)     [facility] Weekly seesion - 03/13/23 1100       [facility] "PREP" Location   [facility] "PREP" Location [name]      Weekly Session   Topic Discussed Finding support   Review of multiple food labels brought from home   Minutes exercised this week 400 minutes    Classes attended to date 37             Ariyanna Oien B Alessander Sikorski 03/13/2023, 11:18 AM

## 2023-03-20 NOTE — Progress Notes (Signed)
[facility] PREP Weekly Session  Patient Details  Name: Christina Sherman MRN: [id number] Date of Birth: 1951/08/09 Age: 72 y.o. PCP: Patient, No Pcp Per  Vitals:   03/20/23 1132  Weight: 187 lb (84.8 kg)     [facility] Weekly seesion - 03/20/23 1100       [facility] "PREP" Location   [facility] "PREP" Location [name]      Weekly Session   Topic Discussed Calorie breakdown   Carbs, fats and protens; membership talk w/Nick   Minutes exercised this week 250 minutes    Classes attended to date 30             [place] Scheiderer B Malacki Mcphearson 03/20/2023, 11:33 AM

## 2023-03-27 NOTE — Progress Notes (Signed)
[facility] PREP Weekly Session  Patient Details  Name: Christina [facility] MRN: [id number] Date of Birth: 1951-08-17 Age: 72 y.o. PCP: Patient, No Pcp Per  Vitals:   03/27/23 1137  Weight: 185 lb 9.6 oz (84.2 kg)     [facility] Weekly seesion - 03/27/23 1100       [facility] "PREP" Location   [facility] "PREP" Location [name]      Weekly Session   Topic Discussed Hitting roadblocks   Review of goals and activity plan for next 3 months and PREP survey to bring to final assessment visit next week.   Minutes exercised this week 250 minutes    Classes attended to date 108             Christina Sherman Christina [facility] 03/27/2023, 11:38 AM

## 2023-04-03 NOTE — Progress Notes (Signed)
[facility] PREP Weekly Session  Patient Details  Name: Christina [facility] MRN: [id number] Date of Birth: 09-25-1951 Age: 72 y.o. PCP: Patient, No Pcp Per  Vitals:   04/03/23 1135  Weight: 185 lb 12.8 oz (84.3 kg)     [facility] Weekly seesion - 04/03/23 1100       [facility] "PREP" Location   [facility] "PREP" Location [name]      Weekly Session   Topic Discussed Other   how fit and strong survery completed; appt made for final assessment visit Thursday; fit testing completed.   Minutes exercised this week 220 minutes    Classes attended to date 64             Christina Sherman Christina [facility] 04/03/2023, 11:36 AM

## 2023-04-05 NOTE — Progress Notes (Signed)
[facility] PREP Evaluation  Patient Details  Name: Christina [facility] MRN: [id number] Date of Birth: [birth year] Age: 72 y.o. PCP: Patient, No Pcp Per  Vitals:   04/05/23 1059  BP: 122/74  Pulse: (!) 56  SpO2: 97%  Weight: 185 lb (83.9 kg)     [facility] Eval - 04/05/23 1100       [facility] "PREP" Location   [facility] "PREP" Location [name]      Referral    Program Start Date 01/16/23    Program End Date 04/05/23      Measurement   Waist Circumference 39.5 inches    Waist Circumference End Program 37 inches    Hip Circumference 47.5 inches    Hip Circumference End Program 45 inches    Body fat 43.2 percent      Mobility and Daily Activities   I find it easy to walk up or down two or more flights of stairs. 4    I have no trouble taking out the trash. 4    I do housework such as vacuuming and dusting on my own without difficulty. 4    I can easily lift a gallon of milk (8lbs). 4    I can easily walk a mile. 4    I have no trouble reaching into high cupboards or reaching down to pick up something from the floor. 4    I do not have trouble doing out-door work such as Loss adjuster, chartered, raking leaves, or gardening. 4      Mobility and Daily Activities   I feel younger than my age. 4    I feel independent. 4    I feel energetic. 4    I live an active life.  4    I feel strong. 4    I feel healthy. 4    I feel active as other people my age. 4      How fit and strong are you.   Fit and Strong Total Score 56            Past Medical History:  Diagnosis Date   H/O seasonal allergies    No past surgical history on file. Social History   Tobacco Use  Smoking Status Never  Smokeless Tobacco Never  Wt loss: 5 pounds Inches lost: 5 Education sessions completed: 12 Exercise sessions completed: 10 How fit and strong survey: 01/15/23: 53 04/05/23: 56   Zakry Caso B Jenesis Martin 04/05/2023, 11:02 AM

## 2023-10-29 ENCOUNTER — Emergency Department (HOSPITAL_BASED_OUTPATIENT_CLINIC_OR_DEPARTMENT_OTHER)
Admission: EM | Admit: 2023-10-29 | Discharge: 2023-10-30 | Disposition: A | Discharge status: Home / Self Care | Payer: PPO | Attending: Emergency Medicine | Admitting: Emergency Medicine

## 2023-10-29 ENCOUNTER — Other Ambulatory Visit: Payer: Self-pay

## 2023-10-29 ENCOUNTER — Encounter (HOSPITAL_BASED_OUTPATIENT_CLINIC_OR_DEPARTMENT_OTHER): Payer: Self-pay

## 2023-10-29 DIAGNOSIS — T783XXA Angioneurotic edema, initial encounter: Secondary | ICD-10-CM | POA: Insufficient documentation

## 2023-10-29 DIAGNOSIS — I10 Essential (primary) hypertension: Secondary | ICD-10-CM | POA: Insufficient documentation

## 2023-10-29 DIAGNOSIS — R220 Localized swelling, mass and lump, head: Secondary | ICD-10-CM | POA: Diagnosis present

## 2023-10-29 HISTORY — DX: Essential (primary) hypertension: I10

## 2023-10-29 NOTE — ED Triage Notes (Signed)
Pt arrives with c/o facial and tongue swelling that started tonight. Pt started taking bactrim thursday for an ear infection and sinusitis. Pt report difficulty swallowing and swelling to right side of her face and tongue. Pt endorses nausea.

## 2023-10-30 MED ORDER — METHYLPREDNISOLONE SODIUM SUCC 125 MG IJ SOLR
125.0000 mg | Freq: Once | INTRAMUSCULAR | Status: AC
  Administered 2023-10-30: 125 mg via INTRAVENOUS
  Filled 2023-10-30: qty 2

## 2023-10-30 MED ORDER — EPINEPHRINE 0.3 MG/0.3ML IJ SOAJ
0.3000 mg | Freq: Once | INTRAMUSCULAR | Status: AC
  Administered 2023-10-30: 0.3 mg via INTRAMUSCULAR
  Filled 2023-10-30: qty 0.3

## 2023-10-30 MED ORDER — DIPHENHYDRAMINE HCL 50 MG/ML IJ SOLN
25.0000 mg | Freq: Once | INTRAMUSCULAR | Status: AC
  Administered 2023-10-30: 25 mg via INTRAVENOUS
  Filled 2023-10-30 (×2): qty 1

## 2023-10-30 MED ORDER — FAMOTIDINE IN NACL 20-0.9 MG/50ML-% IV SOLN
20.0000 mg | Freq: Once | INTRAVENOUS | Status: AC
  Administered 2023-10-30: 20 mg via INTRAVENOUS
  Filled 2023-10-30: qty 50

## 2023-10-30 MED ORDER — EPINEPHRINE 0.3 MG/0.3ML IJ SOAJ: 0.3000 mg | INTRAMUSCULAR | 0 refills | Status: AC | PRN

## 2023-10-30 NOTE — ED Provider Notes (Signed)
 [facility] [facility]  Provider Note  CSN: [id number] Arrival date & time: 10/29/23 2339  History Chief Complaint  Patient presents with   Facial Swelling    Christina Sherman is a 72 y.o. female with history of HTN on chlorthalidone reports she began taking bactrim 4 days ago for sinus and ear infection. She began having some R tongue swelling earlier tonight that included the side of her face. She reported some difficulty swallowing in triage. No difficulty breathing.    Home Medications Prior to Admission medications   Medication Sig Start Date End Date Taking? Authorizing Provider  EPINEPHrine  0.3 mg/0.3 mL IJ SOAJ injection Inject 0.3 mg into the muscle as needed for anaphylaxis. 10/30/23  Yes Roselyn Carlin NOVAK, MD  diphenhydrAMINE  (BENADRYL ) 25 MG tablet Take 25 mg by mouth every 6 (six) hours as needed for allergies.    [provider]  montelukast (SINGULAIR) 10 MG tablet Take 10 mg by mouth at bedtime.    [provider]     Allergies    Ampicillin, Macrodantin, Other, and Penicillins   Review of Systems   Review of Systems Please see HPI for pertinent positives and negatives  Physical Exam BP 122/64   Pulse (!) 56   Temp 98.4 F (36.9 C)   Resp 16   Wt 83.9 kg   SpO2 93%   BMI 31.76 kg/m   Physical Exam Vitals and nursing note reviewed.  Constitutional:      Appearance: Normal appearance.  HENT:     Head: Normocephalic and atraumatic.     Nose: Nose normal.     Mouth/Throat:     Mouth: Mucous membranes are moist.     Comments: Mild angioedema of R anterior tongue, posterior pharynx is normal Eyes:     Extraocular Movements: Extraocular movements intact.     Conjunctiva/sclera: Conjunctivae normal.  Cardiovascular:     Rate and Rhythm: Normal rate.  Pulmonary:     Effort: Pulmonary effort is normal.     Breath sounds: Normal breath sounds. No stridor.  Abdominal:     General: Abdomen is flat.      Palpations: Abdomen is soft.     Tenderness: There is no abdominal tenderness.  Musculoskeletal:        General: No swelling. Normal range of motion.     Cervical back: Neck supple.  Skin:    General: Skin is warm and dry.  Neurological:     General: No focal deficit present.     Mental Status: She is alert.  Psychiatric:        Mood and Affect: Mood normal.     ED Results / Procedures / Treatments   EKG None  Procedures .Critical Care  Performed by: Roselyn Carlin NOVAK, MD Authorized by: Roselyn Carlin NOVAK, MD   Critical care provider statement:    Critical care time (minutes):  45   Critical care time was exclusive of:  Separately billable procedures and treating other patients   Critical care was necessary to treat or prevent imminent or life-threatening deterioration of the following conditions: anaphylaxis.   Critical care was time spent personally by me on the following activities:  Development of treatment plan with patient or surrogate, discussions with consultants, evaluation of patient's response to treatment, examination of patient, ordering and review of laboratory studies, ordering and review of radiographic studies, ordering and performing treatments and interventions, pulse oximetry, re-evaluation of patient's condition and review of  old charts   Medications Ordered in the ED Medications  diphenhydrAMINE  (BENADRYL ) injection 25 mg (25 mg Intravenous Given 10/30/23 0027)  methylPREDNISolone  sodium succinate (SOLU-MEDROL ) 125 mg/2 mL injection 125 mg (125 mg Intravenous Given 10/30/23 0027)  famotidine  (PEPCID ) IVPB 20 mg premix (0 mg Intravenous Stopped 10/30/23 0106)  EPINEPHrine  (EPI-PEN) injection 0.3 mg (0.3 mg Intramuscular Given 10/30/23 0026)    Initial Impression and Plan  Patient here with angioedema of tongue, likely from recent bactrim use, she is not on an ACE/ARB. Will give steroids, antihistamine and epi-pen and monitor in the ED for minimum 4  hours.   ED Course   Clinical Course as of 10/30/23 0425  Tue Oct 30, 2023  0423 Patient's tongue swelling is improved. Airway remains patent. Plan discharge with Rx for EpiPen  if needed in the future. PCP follow up, RTED for any other concerns.   [CS]    Clinical Course User Index [CS] Roselyn Carlin NOVAK, MD     MDM Rules/Calculators/A&P Medical Decision Making Problems Addressed: Angioedema of tongue: acute illness or injury  Risk Prescription drug management.     Final Clinical Impression(s) / ED Diagnoses Final diagnoses:  Angioedema of tongue    Rx / DC Orders ED Discharge Orders          Ordered    EPINEPHrine  0.3 mg/0.3 mL IJ SOAJ injection  As needed        10/30/23 0424             Roselyn Carlin NOVAK, MD 10/30/23 [phone]

## 2024-01-01 ENCOUNTER — Other Ambulatory Visit: Payer: Self-pay | Admitting: Obstetrics and Gynecology

## 2024-01-01 DIAGNOSIS — Z1231 Encounter for screening mammogram for malignant neoplasm of breast: Secondary | ICD-10-CM

## 2024-02-04 ENCOUNTER — Other Ambulatory Visit: Payer: Self-pay | Admitting: Obstetrics and Gynecology

## 2024-02-04 ENCOUNTER — Ambulatory Visit
Admission: RE | Admit: 2024-02-04 | Discharge: 2024-02-04 | Disposition: A | Discharge status: Home / Self Care | Source: Ambulatory Visit | Attending: Obstetrics and Gynecology | Admitting: Obstetrics and Gynecology

## 2024-02-04 DIAGNOSIS — Z1231 Encounter for screening mammogram for malignant neoplasm of breast: Secondary | ICD-10-CM

## 2024-02-04 DIAGNOSIS — Z13820 Encounter for screening for osteoporosis: Secondary | ICD-10-CM

## 2024-02-07 ENCOUNTER — Other Ambulatory Visit: Payer: Self-pay | Admitting: Obstetrics and Gynecology

## 2024-02-07 DIAGNOSIS — R928 Other abnormal and inconclusive findings on diagnostic imaging of breast: Secondary | ICD-10-CM

## 2024-02-12 ENCOUNTER — Ambulatory Visit
Admission: RE | Admit: 2024-02-12 | Discharge: 2024-02-12 | Disposition: A | Discharge status: Home / Self Care | Source: Ambulatory Visit | Attending: Obstetrics and Gynecology | Admitting: Obstetrics and Gynecology

## 2024-02-12 ENCOUNTER — Other Ambulatory Visit: Payer: Self-pay | Admitting: Obstetrics and Gynecology

## 2024-02-12 DIAGNOSIS — N6310 Unspecified lump in the right breast, unspecified quadrant: Secondary | ICD-10-CM

## 2024-02-12 DIAGNOSIS — R928 Other abnormal and inconclusive findings on diagnostic imaging of breast: Secondary | ICD-10-CM

## 2024-02-20 ENCOUNTER — Ambulatory Visit
Admission: RE | Admit: 2024-02-20 | Discharge: 2024-02-20 | Disposition: A | Discharge status: Home / Self Care | Source: Ambulatory Visit | Attending: Obstetrics and Gynecology | Admitting: Obstetrics and Gynecology

## 2024-02-20 ENCOUNTER — Encounter

## 2024-02-20 ENCOUNTER — Other Ambulatory Visit

## 2024-02-20 DIAGNOSIS — N6310 Unspecified lump in the right breast, unspecified quadrant: Secondary | ICD-10-CM

## 2024-02-20 HISTORY — PX: BREAST BIOPSY: SHX20

## 2024-02-21 LAB — SURGICAL PATHOLOGY
